# Patient Record
Sex: Female | Born: 1980 | Race: Black or African American | Hispanic: No | Marital: Single | Smoking: Current every day smoker
Health system: Southern US, Community
[De-identification: ages and names within clinical notes are randomized; demographics above are authoritative.]

## PROBLEM LIST (undated history)

## (undated) ENCOUNTER — Inpatient Hospital Stay (HOSPITAL_COMMUNITY): Payer: Self-pay

## (undated) DIAGNOSIS — O009 Unspecified ectopic pregnancy without intrauterine pregnancy: Secondary | ICD-10-CM

## (undated) DIAGNOSIS — D219 Benign neoplasm of connective and other soft tissue, unspecified: Secondary | ICD-10-CM

---
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
## 2005-06-21 ENCOUNTER — Emergency Department (HOSPITAL_COMMUNITY): Admission: EM | Admit: 2005-06-21 | Discharge: 2005-06-21 | Payer: Self-pay | Admitting: *Deleted

## 2006-03-22 ENCOUNTER — Emergency Department (HOSPITAL_COMMUNITY): Admission: EM | Admit: 2006-03-22 | Discharge: 2006-03-22 | Payer: Self-pay | Admitting: *Deleted

## 2014-01-17 ENCOUNTER — Encounter (HOSPITAL_COMMUNITY): Payer: Self-pay | Admitting: Emergency Medicine

## 2014-01-17 ENCOUNTER — Emergency Department (INDEPENDENT_AMBULATORY_CARE_PROVIDER_SITE_OTHER)
Admission: EM | Admit: 2014-01-17 | Discharge: 2014-01-17 | Disposition: A | Discharge status: Home / Self Care | Payer: Self-pay | Source: Home / Self Care | Attending: Emergency Medicine | Admitting: Emergency Medicine

## 2014-01-17 ENCOUNTER — Other Ambulatory Visit (HOSPITAL_COMMUNITY)
Admission: RE | Admit: 2014-01-17 | Discharge: 2014-01-17 | Disposition: A | Discharge status: Home / Self Care | Payer: Self-pay | Source: Ambulatory Visit | Attending: Emergency Medicine | Admitting: Emergency Medicine

## 2014-01-17 DIAGNOSIS — K610 Anal abscess: Secondary | ICD-10-CM

## 2014-01-17 DIAGNOSIS — K612 Anorectal abscess: Secondary | ICD-10-CM

## 2014-01-17 DIAGNOSIS — N938 Other specified abnormal uterine and vaginal bleeding: Secondary | ICD-10-CM

## 2014-01-17 DIAGNOSIS — Z113 Encounter for screening for infections with a predominantly sexual mode of transmission: Secondary | ICD-10-CM | POA: Insufficient documentation

## 2014-01-17 DIAGNOSIS — N760 Acute vaginitis: Secondary | ICD-10-CM | POA: Insufficient documentation

## 2014-01-17 LAB — POCT URINALYSIS DIP (DEVICE)
Bilirubin Urine: NEGATIVE
Glucose, UA: NEGATIVE mg/dL
Ketones, ur: 15 mg/dL — AB
Leukocytes, UA: NEGATIVE
Nitrite: NEGATIVE
Protein, ur: 30 mg/dL — AB
Specific Gravity, Urine: >=1.03 (ref 1.005–1.030)
Urobilinogen, UA: 2 mg/dL — ABNORMAL HIGH (ref 0.0–1.0)
pH: 6 (ref 5.0–8.0)

## 2014-01-17 LAB — POCT PREGNANCY, URINE
Preg Test, Ur: NEGATIVE
Preg Test, Ur: NEGATIVE

## 2014-01-17 MED ORDER — METRONIDAZOLE 500 MG PO TABS: 500.0000 mg | ORAL_TABLET | Freq: Two times a day (BID) | ORAL | Status: DC

## 2014-01-17 MED ORDER — HYDROCODONE-ACETAMINOPHEN 5-325 MG PO TABS: ORAL_TABLET | ORAL | Status: DC

## 2014-01-17 MED ORDER — MEDROXYPROGESTERONE ACETATE 10 MG PO TABS: 10.0000 mg | ORAL_TABLET | Freq: Every day | ORAL | Status: DC

## 2014-01-17 MED ORDER — CIPROFLOXACIN HCL 500 MG PO TABS: 500.0000 mg | ORAL_TABLET | Freq: Two times a day (BID) | ORAL | Status: DC

## 2014-01-17 NOTE — Discharge Instructions (Signed)
Abnormal Uterine Bleeding Abnormal uterine bleeding means bleeding from the vagina that is not your normal menstrual period. This can be:  Bleeding or spotting between periods.  Bleeding after sex (sexual intercourse).  Bleeding that is heavier or more than normal.  Periods that last longer than usual.  Bleeding after menopause. There are many problems that may cause this. Treatment will depend on the cause of the bleeding. Any kind of bleeding that is not normal should be reviewed by your doctor.  HOME CARE Watch your condition for any changes. These actions may lessen any discomfort you are having:  Do not use tampons or douches as told by your doctor.  Change your pads often. You should get regular pelvic exams and Pap tests. Keep all appointments for tests as told by your doctor. GET HELP IF:  You are bleeding for more than 1 week.  You feel dizzy at times. GET HELP RIGHT AWAY IF:   You pass out.  You have to change pads every 15 to 30 minutes.  You have belly pain.  You have a fever.  You become sweaty or weak.  You are passing large blood clots from the vagina.  You feel sick to your stomach (nauseous) and throw up (vomit). MAKE SURE YOU:  Understand these instructions.  Will watch your condition.  Will get help right away if you are not doing well or get worse. Document Released: 05/29/2009 Document Revised: 05/22/2013 Document Reviewed: 02/28/2013 [facility] Patient Information 2014 [place], [place].  Peri-Rectal Abscess Your caregiver has diagnosed you as having a peri-rectal abscess. This is an infected area near the rectum that is filled with pus. If the abscess is near the surface of the skin, your caregiver may open (incise) the area and drain the pus. HOME CARE INSTRUCTIONS   If your abscess was opened up and drained. A small piece of gauze may be placed in the opening so that it can drain. Do not remove the gauze unless directed by your  caregiver.  A loose dressing may be placed over the abscess site. Change the dressing as often as necessary to keep it clean and dry.  After the drain is removed, the area may be washed with a gentle antiseptic (soap) four times per day.  A warm sitz bath, warm packs or heating pad may be used for pain relief, taking care not to burn yourself.  Return for a wound check in 1 day or as directed.  An "inflatable doughnut" may be used for sitting with added comfort. These can be purchased at a drugstore or medical supply house.  To reduce pain and straining with bowel movements, eat a high fiber diet with plenty of fruits and vegetables. Use stool softeners as recommended by your caregiver. This is especially important if narcotic type pain medications were prescribed as these may cause marked constipation.  Only take over-the-counter or prescription medicines for pain, discomfort, or fever as directed by your caregiver. SEEK IMMEDIATE MEDICAL CARE IF:   You have increasing pain that is not controlled by medication.  There is increased inflammation (redness), swelling, bleeding, or drainage from the area.  An oral temperature above 102 F (38.9 C) develops.  You develop chills or generalized malaise (feel lethargic or feel "washed out").  You develop any new symptoms (problems) you feel may be related to your present problem. Document Released: 07/29/2000 Document Revised: 10/24/2011 Document Reviewed: 07/29/2008 [facility] Patient Information 2014 [place].

## 2014-01-17 NOTE — ED Notes (Signed)
Pt c/o rectal pain onset 3 days Sx also include mild abd/back pain; 3/10 Denies bloody stools, urinary sx; normal bowel movements Alert w/no signs of acute distress.

## 2014-01-17 NOTE — ED Provider Notes (Signed)
Chief Complaint   Chief Complaint  Patient presents with  . Rectal Pain    History of Present Illness   Jessica Melton is a 33 year old female who presents with 2 problems: Anal pain, and abnormal vaginal bleeding.  1. Anal pain: This is been going on for 2-3 days. It's localized in the left side. She denies any fever or drainage. No constipation or diarrhea. No blood in the stool.  2. Abnormal bleeding: The patient received a Depo-Provera shot in January. She didn't have any bleeding for about 3 months, then for the past 2 months she's had daily bleeding, about the same as her normal menses with some clots she denies any pelvic pain or cramping. She is sexually active with use of condoms.  Review of Systems   Other than as noted above, the patient denies any of the following symptoms: Systemic:  No fever or chills GI:  No abdominal pain, nausea, vomiting, diarrhea, constipation, melena or hematochezia. GU:  No dysuria, frequency, urgency, hematuria, vaginal discharge, itching, or abnormal vaginal bleeding.  [place]   Past medical history, family history, social history, meds, and allergies were reviewed.    Physical Examination    Vital signs:  BP 125/76  Pulse 93  Temp(Src) 98.6 F (37 C) (Oral)  Resp 18  SpO2 100% General:  Alert, oriented and in no distress. Lungs:  Breath sounds clear and equal bilaterally.  No wheezes, rales or rhonchi. Heart:  Regular rhythm.  No gallops or murmers. Abdomen:  Soft, flat and non-distended.  No organomegaly or mass.  No tenderness, guarding or rebound.  Bowel sounds normally active. Pelvic exam:  Normal external genitalia, vaginal and cervical mucosa were unremarkable. There was no blood in the vaginal vault. No pain on cervical motion. Uterus was normal in size and shape and nontender. No adnexal mass or tenderness.  DNA probes for gonorrhea, Chlamydia, Trichomonas, Gardnerella, Candida were obtained. Rectal exam: Just adjacent to the  anus, on the left side and posterior to the anus, there was a 1.5 cm raised, red, fluctuant area without any drainage. Skin:  Clear, warm and dry.  Chaperoned by Mickeal Needy, EMT who was present throughout the pelvic exam.   Labs   Results for orders placed during the hospital encounter of 01/17/14  POCT URINALYSIS DIP (DEVICE)      Result Value Ref Range   Glucose, UA NEGATIVE  NEGATIVE mg/dL   Bilirubin Urine NEGATIVE  NEGATIVE   Ketones, ur 15 (*) NEGATIVE mg/dL   Specific Gravity, Urine >=1.030  1.005 - 1.030   Hgb urine dipstick MODERATE (*) NEGATIVE   pH 6.0  5.0 - 8.0   Protein, ur 30 (*) NEGATIVE mg/dL   Urobilinogen, UA 2.0 (*) 0.0 - 1.0 mg/dL   Nitrite NEGATIVE  NEGATIVE   Leukocytes, UA NEGATIVE  NEGATIVE  POCT PREGNANCY, URINE      Result Value Ref Range   Preg Test, Ur NEGATIVE  NEGATIVE  POCT PREGNANCY, URINE      Result Value Ref Range   Preg Test, Ur NEGATIVE  NEGATIVE    Procedure Note:  Verbal informed consent was obtained from the patient.  Risks and benefits were outlined with the patient.  Patient understands and accepts these risks. A time out was called and the procedure and identity of the patient were confirmed verbally.    The procedure was then performed as follows:  The abscess in the perianal area was prepped with Betadine and alcohol and anesthetized with  2% Xylocaine without epinephrine. A single incision was made into the area of fluctuance a large amount of malodorous pus was drained out. A small amount of packing was put in place followed by sterile dressing. The patient is to return again in 48 hours to have this removed. A culture of the pus was obtained.  The patient tolerated the procedure well without any immediate complications.    Assessment   The primary encounter diagnosis was Anal abscess. A diagnosis of Dysfunctional uterine bleeding was also pertinent to this visit.       Plan    1.  Meds:  The following meds were prescribed:    Discharge Medication List as of 01/17/2014  8:47 PM    START taking these medications   Details  ciprofloxacin (CIPRO) 500 MG tablet Take 1 tablet (500 mg total) by mouth every 12 (twelve) hours., Starting 01/17/2014, Until Discontinued, Normal    HYDROcodone-acetaminophen (NORCO/VICODIN) 5-325 MG per tablet 1 to 2 tabs every 4 to 6 hours as needed for pain., Print    medroxyPROGESTERone (PROVERA) 10 MG tablet Take 1 tablet (10 mg total) by mouth daily., Starting 01/17/2014, Until Discontinued, Normal    metroNIDAZOLE (FLAGYL) 500 MG tablet Take 1 tablet (500 mg total) by mouth 2 (two) times daily., Starting 01/17/2014, Until Discontinued, Normal        2.  Patient Education/Counseling:  The patient was given appropriate handouts, self care instructions, and instructed in symptomatic relief.  After the patient has a packing removal she may start warm sitz baths with Epsom salts.  3.  Follow up:  The patient was told to follow up here if no better in 3 to 4 days, or sooner if becoming worse in any way, and given some red flag symptoms such as worsening pain, fever, persistent vomiting, or heavy vaginal bleeding which would prompt immediate return.  Followup here in 48 hours for recheck on abscess, if abnormal bleeding persists should followup at [facility] hospital clinics.     Harden Mo, MD 01/17/14 [phone]

## 2014-01-20 ENCOUNTER — Telehealth (HOSPITAL_COMMUNITY): Payer: Self-pay | Admitting: *Deleted

## 2014-01-21 LAB — CULTURE, ROUTINE-ABSCESS: Special Requests: NORMAL

## 2014-01-22 NOTE — ED Notes (Signed)
GC/Chlamydia neg., Affirm: Candida neg., Gardnerella and Trich pos., Abscess culture anal:  Multiple organisms present, none predominant.  Pt. adequately treated with Flagyl.  6/8 Left message.  Call 1. 6/9 Left message.  Call 2. 6/10 Left message.  Call 3. Roselyn Meier 01/22/2014

## 2014-01-24 NOTE — ED Notes (Signed)
Unable to reach pt. by phone x 3. Confidential marked letter sent with results and instructions. 01/24/2014

## 2014-03-06 ENCOUNTER — Emergency Department (INDEPENDENT_AMBULATORY_CARE_PROVIDER_SITE_OTHER)
Admission: EM | Admit: 2014-03-06 | Discharge: 2014-03-06 | Disposition: A | Discharge status: Home / Self Care | Payer: Medicaid Other | Source: Home / Self Care | Attending: Family Medicine | Admitting: Family Medicine

## 2014-03-06 ENCOUNTER — Encounter (HOSPITAL_COMMUNITY): Payer: Self-pay | Admitting: Emergency Medicine

## 2014-03-06 DIAGNOSIS — N939 Abnormal uterine and vaginal bleeding, unspecified: Secondary | ICD-10-CM

## 2014-03-06 DIAGNOSIS — N926 Irregular menstruation, unspecified: Secondary | ICD-10-CM

## 2014-03-06 LAB — POCT I-STAT, CHEM 8
BUN: 9 mg/dL (ref 6–23)
CHLORIDE: 104 meq/L (ref 96–112)
CREATININE: 0.8 mg/dL (ref 0.50–1.10)
Calcium, Ion: 1.28 mmol/L — ABNORMAL HIGH (ref 1.12–1.23)
GLUCOSE: 92 mg/dL (ref 70–99)
HCT: 41 % (ref 36.0–46.0)
Hemoglobin: 13.9 g/dL (ref 12.0–15.0)
POTASSIUM: 3.7 meq/L (ref 3.7–5.3)
Sodium: 139 mEq/L (ref 137–147)
TCO2: 22 mmol/L (ref 0–100)

## 2014-03-06 LAB — POCT PREGNANCY, URINE: Preg Test, Ur: NEGATIVE

## 2014-03-06 MED ORDER — MEDROXYPROGESTERONE ACETATE 10 MG PO TABS: 10.0000 mg | ORAL_TABLET | Freq: Every day | ORAL | Status: DC

## 2014-03-06 MED ORDER — NORETHINDRONE 0.35 MG PO TABS: 1.0000 | ORAL_TABLET | Freq: Every day | ORAL | Status: DC

## 2014-03-06 NOTE — Discharge Instructions (Signed)
Your bleeding is a normal response to hormonal control of your periods or let down from hormonal use.  This may take several  Months to regulate or become normal Your symptoms are all due to your period There is no evidence for serious underlying illness.  Please start the provera.  You can start the birth control pills if you want after the provera.  Use ibuprofen 400-600mg  every 4-6 hours, hydration, and rest as needed for your symptoms.

## 2014-03-06 NOTE — ED Provider Notes (Signed)
CSN: [id number]     Arrival date & time 03/06/14  1039 History   First MD Initiated Contact with Patient 03/06/14 1127     Chief Complaint  Patient presents with  . Metrorrhagia  . Abdominal Cramping   (Consider location/radiation/quality/duration/timing/severity/associated sxs/prior Treatment) HPI  Abnormal uterine bleeding: seen on 6/5. For similar complaint. Previously on depo but developed abnormally long periods after stopping. Given provera for 5 days. Which worked initially. LMP 3 days ago but is extra heavy and w/ clots. Associated w/ dizziness and weakness. Denies CP, SOb, palpitations, Syncope. Sexually active and not using any birth control and only 50% use of condoms.  History reviewed. No pertinent past medical history. History reviewed. No pertinent past surgical history. History reviewed. No pertinent family history. History  Substance Use Topics  . Smoking status: Current Every Day Smoker -- 0.50 packs/day    Types: Cigarettes  . Smokeless tobacco: Not on file  . Alcohol Use: Yes   OB History   Grav Para Term Preterm Abortions TAB SAB Ect Mult Living                 Review of Systems Per HPI with all other pertinent systems negative.    Allergies  Review of patient's allergies indicates no known allergies.  Home Medications   Prior to Admission medications   Medication Sig Start Date End Date Taking? Authorizing Provider  ciprofloxacin (CIPRO) 500 MG tablet Take 1 tablet (500 mg total) by mouth every 12 (twelve) hours. 01/17/14   Harden Mo, MD  HYDROcodone-acetaminophen (NORCO/VICODIN) 5-325 MG per tablet 1 to 2 tabs every 4 to 6 hours as needed for pain. 01/17/14   Harden Mo, MD  medroxyPROGESTERone (PROVERA) 10 MG tablet Take 1 tablet (10 mg total) by mouth daily. 03/06/14   Waldemar Dickens, MD  metroNIDAZOLE (FLAGYL) 500 MG tablet Take 1 tablet (500 mg total) by mouth 2 (two) times daily. 01/17/14   Harden Mo, MD  norethindrone  (MICRONOR,CAMILA,ERRIN) 0.35 MG tablet Take 1 tablet (0.35 mg total) by mouth daily. 03/06/14   Waldemar Dickens, MD   BP 113/75  Pulse 72  Temp(Src) 98.7 F (37.1 C) (Oral)  Resp 16  SpO2 100% Physical Exam  Constitutional: She appears well-developed and well-nourished. No distress.  HENT:  Head: Normocephalic and atraumatic.  Eyes: EOM are normal. Pupils are equal, round, and reactive to light.  Neck: Normal range of motion.  Pulmonary/Chest: Effort normal. No respiratory distress.  Abdominal: Soft. She exhibits no distension.  Musculoskeletal: She exhibits no tenderness.  Neurological: She is alert. She exhibits normal muscle tone.  Skin: Skin is warm. No rash noted. She is not diaphoretic.  Psychiatric: She has a normal mood and affect. Her behavior is normal. Judgment and thought content normal.    ED Course  Procedures (including critical care time) Labs Review Labs Reviewed  POCT I-STAT, CHEM 8 - Abnormal; Notable for the following:    Calcium, Ion 1.28 (*)    All other components within normal limits  PREGNANCY, URINE  POCT PREGNANCY, URINE    Imaging Review No results found.    MDM   1. Abnormal uterine bleeding    Likely due to hormonal therapies in the past. Pt symptoms improved after provera but returned which is not surprising. Hgb is nml. Urine preg neg. Recommending waitful watching vs another round of provera followed by OCP. Pt opting to try another round of medications. Of note pt also does not  want to get pregnant. Start provery 10mg  x 10 days followed by [employer]. F/u PCP. NSAIDs, fluids, rest for other symptoms Precautions given and all questions answered   Linna Darner, MD Family Medicine 03/06/2014, 12:23 PM      Waldemar Dickens, MD 03/06/14 [phone]

## 2014-03-06 NOTE — ED Notes (Signed)
Reports being seen in June for a cycle lasting 3 months was given meds to stop bleeding.   States possibly started cycle 3 days ago but is having heavy bleeding with clots.   Abdominal cramping.

## 2014-07-16 ENCOUNTER — Inpatient Hospital Stay (HOSPITAL_COMMUNITY)
Admission: AD | Admit: 2014-07-16 | Discharge: 2014-07-17 | Disposition: A | Discharge status: Home / Self Care | Payer: 59 | Source: Ambulatory Visit | Attending: Obstetrics & Gynecology | Admitting: Obstetrics & Gynecology

## 2014-07-16 ENCOUNTER — Emergency Department (HOSPITAL_COMMUNITY)
Admission: EM | Admit: 2014-07-16 | Discharge: 2014-07-16 | Disposition: A | Discharge status: Nursing Home (Includes ICF) | Payer: 59 | Source: Home / Self Care | Attending: Family Medicine | Admitting: Family Medicine

## 2014-07-16 ENCOUNTER — Inpatient Hospital Stay (HOSPITAL_COMMUNITY): Payer: 59

## 2014-07-16 ENCOUNTER — Encounter (HOSPITAL_COMMUNITY): Payer: Self-pay

## 2014-07-16 ENCOUNTER — Encounter (HOSPITAL_COMMUNITY): Payer: Self-pay | Admitting: *Deleted

## 2014-07-16 DIAGNOSIS — F17210 Nicotine dependence, cigarettes, uncomplicated: Secondary | ICD-10-CM | POA: Insufficient documentation

## 2014-07-16 DIAGNOSIS — Z3490 Encounter for supervision of normal pregnancy, unspecified, unspecified trimester: Secondary | ICD-10-CM

## 2014-07-16 DIAGNOSIS — R109 Unspecified abdominal pain: Secondary | ICD-10-CM | POA: Diagnosis present

## 2014-07-16 DIAGNOSIS — O039 Complete or unspecified spontaneous abortion without complication: Secondary | ICD-10-CM | POA: Diagnosis not present

## 2014-07-16 DIAGNOSIS — Z3A08 8 weeks gestation of pregnancy: Secondary | ICD-10-CM | POA: Diagnosis not present

## 2014-07-16 DIAGNOSIS — O00109 Unspecified tubal pregnancy without intrauterine pregnancy: Secondary | ICD-10-CM | POA: Diagnosis present

## 2014-07-16 DIAGNOSIS — R58 Hemorrhage, not elsewhere classified: Secondary | ICD-10-CM

## 2014-07-16 DIAGNOSIS — N939 Abnormal uterine and vaginal bleeding, unspecified: Secondary | ICD-10-CM

## 2014-07-16 DIAGNOSIS — Z331 Pregnant state, incidental: Secondary | ICD-10-CM

## 2014-07-16 DIAGNOSIS — R1030 Lower abdominal pain, unspecified: Secondary | ICD-10-CM

## 2014-07-16 DIAGNOSIS — F17200 Nicotine dependence, unspecified, uncomplicated: Secondary | ICD-10-CM | POA: Diagnosis present

## 2014-07-16 HISTORY — DX: Unspecified ectopic pregnancy without intrauterine pregnancy: O00.90

## 2014-07-16 HISTORY — DX: Benign neoplasm of connective and other soft tissue, unspecified: D21.9

## 2014-07-16 LAB — POCT URINALYSIS DIP (DEVICE)
Bilirubin Urine: NEGATIVE
Glucose, UA: NEGATIVE mg/dL
KETONES UR: NEGATIVE mg/dL
Leukocytes, UA: NEGATIVE
Nitrite: NEGATIVE
PH: 6 (ref 5.0–8.0)
PROTEIN: NEGATIVE mg/dL
Specific Gravity, Urine: >=1.03 (ref 1.005–1.030)
Urobilinogen, UA: 0.2 mg/dL (ref 0.0–1.0)

## 2014-07-16 LAB — POCT PREGNANCY, URINE: Preg Test, Ur: POSITIVE — AB

## 2014-07-16 LAB — CBC
HEMATOCRIT: 33 % — AB (ref 36.0–46.0)
HEMOGLOBIN: 10.6 g/dL — AB (ref 12.0–15.0)
MCH: 21.5 pg — AB (ref 26.0–34.0)
MCHC: 32.1 g/dL (ref 30.0–36.0)
MCV: 67.1 fL — AB (ref 78.0–100.0)
Platelets: 226 10*3/uL (ref 150–400)
RBC: 4.92 MIL/uL (ref 3.87–5.11)
RDW: 19.8 % — ABNORMAL HIGH (ref 11.5–15.5)
WBC: 7.8 10*3/uL (ref 4.0–10.5)

## 2014-07-16 LAB — URINALYSIS, ROUTINE W REFLEX MICROSCOPIC
BILIRUBIN URINE: NEGATIVE
Glucose, UA: NEGATIVE mg/dL
Ketones, ur: NEGATIVE mg/dL
Leukocytes, UA: NEGATIVE
NITRITE: NEGATIVE
PH: 6 (ref 5.0–8.0)
Protein, ur: NEGATIVE mg/dL
Specific Gravity, Urine: 1.02 (ref 1.005–1.030)
Urobilinogen, UA: 0.2 mg/dL (ref 0.0–1.0)

## 2014-07-16 LAB — URINE MICROSCOPIC-ADD ON

## 2014-07-16 LAB — WET PREP, GENITAL
Trich, Wet Prep: NONE SEEN
YEAST WET PREP: NONE SEEN

## 2014-07-16 LAB — HCG, QUANTITATIVE, PREGNANCY: HCG, BETA CHAIN, QUANT, S: 185 m[IU]/mL — AB (ref <5)

## 2014-07-16 MED ORDER — OXYCODONE-ACETAMINOPHEN 5-325 MG PO TABS
1.0000 | ORAL_TABLET | Freq: Once | ORAL | Status: AC
  Administered 2014-07-16: 1 via ORAL
  Filled 2014-07-16: qty 1

## 2014-07-16 MED ORDER — HYDROMORPHONE HCL 1 MG/ML IJ SOLN
1.0000 mg | Freq: Once | INTRAMUSCULAR | Status: AC
  Administered 2014-07-17: 1 mg via INTRAMUSCULAR
  Filled 2014-07-16: qty 1

## 2014-07-16 MED ORDER — PROMETHAZINE HCL 25 MG PO TABS
25.0000 mg | ORAL_TABLET | Freq: Once | ORAL | Status: AC
  Administered 2014-07-16: 25 mg via ORAL
  Filled 2014-07-16: qty 1

## 2014-07-16 NOTE — MAU Note (Signed)
Spotting since last Wednesday. Positive pregnancy test at home & urgent care. Lower abdominal pain today. Denies urinary complaints. Unsure when LMP was, thinks sometime in October.

## 2014-07-16 NOTE — ED Notes (Signed)
Pt. asked for something for pain.  She said her brother is here now and can drive her.  I asked PA and he said nothing by mouth now.  Pt. States she has been in pain all day and is afraid she is going to have to wait. I told her [facility] called and they will be expecting her.

## 2014-07-16 NOTE — ED Notes (Signed)
[facility] and will be discharged from here.

## 2014-07-16 NOTE — ED Notes (Signed)
C/o lower abdominal pain onset today.  Vaginal bleeding onset last Wednesday.  It was light until today.  She had to wear a pad today.  Only 1 pad-states it is bright red blood.  C/o nausea.  V x 1 today and "for the past week 2-3 x a day and may skips a day here and there."  Breasts are tender.  Had pos. preg test at home.

## 2014-07-16 NOTE — ED Provider Notes (Signed)
CSN: [id number]     Arrival date & time 07/16/14  1855 History   First MD Initiated Contact with Patient 07/16/14 1911     No chief complaint on file.  (Consider location/radiation/quality/duration/timing/severity/associated sxs/prior Treatment) HPI           33 year old female with history of ectopic pregnancy presents complaining of abdominal pain, vaginal bleeding, and positive urine pregnancy test. She has had abdominal pain for about one week that is across her lower abdomen, crampy in nature. She has also had slight spotting. Today the bleeding and the pain got to be more heavy. She did a urine pregnancy test at home which was positive. She denies fever, chills, NVD.  No past medical history on file. No past surgical history on file. No family history on file. History  Substance Use Topics  . Smoking status: Current Every Day Smoker -- 0.50 packs/day    Types: Cigarettes  . Smokeless tobacco: Not on file  . Alcohol Use: Yes   OB History    No data available     Review of Systems  Gastrointestinal: Positive for abdominal pain. Negative for nausea, vomiting and diarrhea.  All other systems reviewed and are negative.   Allergies  Review of patient's allergies indicates no known allergies.  Home Medications   Prior to Admission medications   Medication Sig Start Date End Date Taking? Authorizing Provider  ciprofloxacin (CIPRO) 500 MG tablet Take 1 tablet (500 mg total) by mouth every 12 (twelve) hours. 01/17/14   Harden Mo, MD  HYDROcodone-acetaminophen (NORCO/VICODIN) 5-325 MG per tablet 1 to 2 tabs every 4 to 6 hours as needed for pain. 01/17/14   Harden Mo, MD  medroxyPROGESTERone (PROVERA) 10 MG tablet Take 1 tablet (10 mg total) by mouth daily. 03/06/14   Waldemar Dickens, MD  metroNIDAZOLE (FLAGYL) 500 MG tablet Take 1 tablet (500 mg total) by mouth 2 (two) times daily. 01/17/14   Harden Mo, MD  norethindrone (MICRONOR,CAMILA,ERRIN) 0.35 MG tablet Take 1 tablet  (0.35 mg total) by mouth daily. 03/06/14   Waldemar Dickens, MD   BP 121/80 mmHg  Pulse 83  Temp(Src) 98.1 F (36.7 C) (Oral)  Resp 15  SpO2 100% Physical Exam  Constitutional: She is oriented to person, place, and time. Vital signs are normal. She appears well-developed and well-nourished. No distress.  HENT:  Head: Normocephalic and atraumatic.  Cardiovascular: Normal rate, regular rhythm and normal heart sounds.   Pulmonary/Chest: Effort normal and breath sounds normal. No respiratory distress.  Abdominal: Soft. Bowel sounds are normal. She exhibits no distension and no mass. There is tenderness (minimal suprapubic tenderness). There is no rebound and no guarding.  Neurological: She is alert and oriented to person, place, and time. She has normal strength. Coordination normal.  Skin: Skin is warm and dry. No rash noted. She is not diaphoretic.  Psychiatric: She has a normal mood and affect. Judgment normal.  Nursing note and vitals reviewed.   ED Course  Procedures (including critical care time) Labs Review Labs Reviewed  POCT URINALYSIS DIP (DEVICE) - Abnormal; Notable for the following:    Hgb urine dipstick LARGE (*)    All other components within normal limits  POCT PREGNANCY, URINE - Abnormal; Notable for the following:    Preg Test, Ur POSITIVE (*)    All other components within normal limits    Imaging Review No results found.   MDM   1. Lower abdominal pain   2. Vagina  bleeding   3. Pregnancy    Urine pregnancy test is positive. Patient is being transferred to [facility] hospital, her brother is here to drive her.  I have expressed to her the [facility] and the consequences of not going, up to and including death.    Liam Graham, PA-C 07/16/14 [place] Mikaele Stecher, PA-C 07/16/14 1947

## 2014-07-16 NOTE — MAU Provider Note (Signed)
History     CSN: [id number]  Arrival date and time: 07/16/14 2018   First Provider Initiated Contact with Patient 07/16/14 2114      Chief Complaint  Patient presents with  . Abdominal Pain  . Vaginal Bleeding   HPI Comments: Jessica Melton 33 y.o. [id number] [redacted]w[redacted]d presents to MAU with vaginal bleeding and abdominal pains that have been off and on since Wednesday. It is now 6/10 and no medications were taken. She is also having some nausea and vomiting. She was seen earlier at Urgent care and sent here. Unsure LMP.  She has a history of Ectopic Pregnancy and SAB.  Abdominal Pain Associated symptoms include nausea and vomiting.  Vaginal Bleeding Associated symptoms include abdominal pain, nausea and vomiting.      Past Medical History  Diagnosis Date  . Fibroids   . Ectopic pregnancy     Past Surgical History  Procedure Laterality Date  . Dilation and curettage of uterus  2003    Family History  Problem Relation Age of Onset  . Cirrhosis Father   . Kidney failure Father     History  Substance Use Topics  . Smoking status: Current Every Day Smoker -- 0.50 packs/day    Types: Cigarettes  . Smokeless tobacco: Not on file  . Alcohol Use: No     Comment: socially    Allergies:  Allergies  Allergen Reactions  . Iodine Swelling    Tongue swelling    Prescriptions prior to admission  Medication Sig Dispense Refill Last Dose  . ciprofloxacin (CIPRO) 500 MG tablet Take 1 tablet (500 mg total) by mouth every 12 (twelve) hours. 14 tablet 0 Unknown at Unknown time  . HYDROcodone-acetaminophen (NORCO/VICODIN) 5-325 MG per tablet 1 to 2 tabs every 4 to 6 hours as needed for pain. 20 tablet 0 Unknown at Unknown time  . ibuprofen (ADVIL,MOTRIN) 800 MG tablet Take 800 mg by mouth every 8 (eight) hours as needed.   07/16/2014 at 1630  . medroxyPROGESTERone (PROVERA) 10 MG tablet Take 1 tablet (10 mg total) by mouth daily. 10 tablet 0   . metroNIDAZOLE (FLAGYL) 500 MG tablet Take  1 tablet (500 mg total) by mouth 2 (two) times daily. 14 tablet 0 Unknown at Unknown time  . norethindrone (MICRONOR,CAMILA,ERRIN) 0.35 MG tablet Take 1 tablet (0.35 mg total) by mouth daily. 1 Package 3     Review of Systems  Constitutional: Negative.   HENT: Negative.   Respiratory: Negative.   Cardiovascular: Negative.   Gastrointestinal: Positive for nausea, vomiting and abdominal pain.  Genitourinary: Negative.        Vaginal bleeding  Musculoskeletal: Negative.   Skin: Negative.   Neurological: Negative.   Psychiatric/Behavioral: Negative.    Physical Exam   Blood pressure 121/80, pulse 82, temperature 99 F (37.2 C), temperature source Oral, resp. rate 16, height 5' 7.5" (1.715 m), weight 105.235 kg (232 lb), last menstrual period 05/16/2014, SpO2 100 %.  Physical Exam  Constitutional: She appears well-developed and well-nourished. No distress.  HENT:  Head: Normocephalic and atraumatic.  Eyes: Pupils are equal, round, and reactive to light.  GI: Soft. There is tenderness.  Genitourinary:  Genital:external bleedy Vaginal: small amount blood/ one fox swab cleared Cervix:closed/ thick Bimanual:uterus tender    Results for orders placed or performed during the hospital encounter of 07/16/14 (from the past 24 hour(s))  Urinalysis, Routine w reflex microscopic     Status: Abnormal   Collection Time: 07/16/14  8:51 PM  Result  Value Ref Range   Color, Urine YELLOW YELLOW   APPearance CLEAR CLEAR   Specific Gravity, Urine 1.020 1.005 - 1.030   pH 6.0 5.0 - 8.0   Glucose, UA NEGATIVE NEGATIVE mg/dL   Hgb urine dipstick LARGE (A) NEGATIVE   Bilirubin Urine NEGATIVE NEGATIVE   Ketones, ur NEGATIVE NEGATIVE mg/dL   Protein, ur NEGATIVE NEGATIVE mg/dL   Urobilinogen, UA 0.2 0.0 - 1.0 mg/dL   Nitrite NEGATIVE NEGATIVE   Leukocytes, UA NEGATIVE NEGATIVE  Urine microscopic-add on     Status: None   Collection Time: 07/16/14  8:51 PM  Result Value Ref Range   Squamous  Epithelial / LPF RARE RARE   WBC, UA 0-2 <3 WBC/hpf   RBC / HPF 7-10 <3 RBC/hpf   Bacteria, UA RARE RARE  Wet prep, genital     Status: Abnormal   Collection Time: 07/16/14  9:20 PM  Result Value Ref Range   Yeast Wet Prep HPF POC NONE SEEN NONE SEEN   Trich, Wet Prep NONE SEEN NONE SEEN   Clue Cells Wet Prep HPF POC FEW (A) NONE SEEN   WBC, Wet Prep HPF POC FEW (A) NONE SEEN  CBC     Status: Abnormal   Collection Time: 07/16/14  9:30 PM  Result Value Ref Range   WBC 7.8 4.0 - 10.5 K/uL   RBC 4.92 3.87 - 5.11 MIL/uL   Hemoglobin 10.6 (L) 12.0 - 15.0 g/dL   HCT 33.0 (L) 36.0 - 46.0 %   MCV 67.1 (L) 78.0 - 100.0 fL   MCH 21.5 (L) 26.0 - 34.0 pg   MCHC 32.1 30.0 - 36.0 g/dL   RDW 19.8 (H) 11.5 - 15.5 %   Platelets 226 150 - 400 K/uL  hCG, quantitative, pregnancy     Status: Abnormal   Collection Time: 07/16/14  9:30 PM  Result Value Ref Range   hCG, Beta Chain, Quant, S 185 (H) <5 mIU/mL  ABO/Rh     Status: None (Preliminary result)   Collection Time: 07/16/14  9:30 PM  Result Value Ref Range   ABO/RH(D) O POS    US Ob Comp Less 14 Wks  07/17/2014   CLINICAL DATA:  Acute onset of lower abdominal pain and vaginal bleeding. Initial encounter.  EXAM: OBSTETRIC <14 WK [place] AND TRANSVAGINAL OB US  TECHNIQUE: Both transabdominal and transvaginal ultrasound examinations were performed for complete evaluation of the gestation as well as the maternal uterus, adnexal regions, and pelvic cul-de-sac. Transvaginal technique was performed to assess early pregnancy.  COMPARISON:  None.  FINDINGS: Intrauterine gestational sac: None seen.  Yolk sac:  N/A  Embryo:  N/A  Maternal uterus/adnexae: An irregular elongated small collection of fluid at the lower uterine segment may reflect a gestational sac. It measures approximately 0.6 cm in mean sac diameter. This raises suspicion for spontaneous abortion in progress.  No subchorionic hemorrhage is noted. The uterus is otherwise unremarkable in appearance.   The ovaries are within normal limits. The right ovary measures 4.2 x 2.0 x 1.8 cm, while the left ovary measures 4.6 x 2.2 x 4.0 cm. No suspicious adnexal masses are seen; there is no evidence for ovarian torsion.  A small amount of free fluid is noted within the pelvic cul-de-sac, mildly complex in appearance.  IMPRESSION: 1. Suspect spontaneous abortion in progress, with a small irregular elongated collection of fluid at the lower uterine segment. Uterus otherwise unremarkable in appearance. 2. Small amount of free fluid within the pelvic  cul-de-sac is mildly complex in appearance, of uncertain significance. No definite evidence of ectopic pregnancy. If the patient's quantitative beta HCG level continues to trend upward, follow-up pelvic ultrasound could be considered as deemed clinically appropriate.   Electronically Signed   By: Garald Balding M.D.   On: 07/17/2014 00:00   US Ob Transvaginal  07/17/2014   CLINICAL DATA:  Acute onset of lower abdominal pain and vaginal bleeding. Initial encounter.  EXAM: OBSTETRIC <14 WK [place] AND TRANSVAGINAL OB US  TECHNIQUE: Both transabdominal and transvaginal ultrasound examinations were performed for complete evaluation of the gestation as well as the maternal uterus, adnexal regions, and pelvic cul-de-sac. Transvaginal technique was performed to assess early pregnancy.  COMPARISON:  None.  FINDINGS: Intrauterine gestational sac: None seen.  Yolk sac:  N/A  Embryo:  N/A  Maternal uterus/adnexae: An irregular elongated small collection of fluid at the lower uterine segment may reflect a gestational sac. It measures approximately 0.6 cm in mean sac diameter. This raises suspicion for spontaneous abortion in progress.  No subchorionic hemorrhage is noted. The uterus is otherwise unremarkable in appearance.  The ovaries are within normal limits. The right ovary measures 4.2 x 2.0 x 1.8 cm, while the left ovary measures 4.6 x 2.2 x 4.0 cm. No suspicious adnexal masses are seen;  there is no evidence for ovarian torsion.  A small amount of free fluid is noted within the pelvic cul-de-sac, mildly complex in appearance.  IMPRESSION: 1. Suspect spontaneous abortion in progress, with a small irregular elongated collection of fluid at the lower uterine segment. Uterus otherwise unremarkable in appearance. 2. Small amount of free fluid within the pelvic cul-de-sac is mildly complex in appearance, of uncertain significance. No definite evidence of ectopic pregnancy. If the patient's quantitative beta HCG level continues to trend upward, follow-up pelvic ultrasound could be considered as deemed clinically appropriate.   Electronically Signed   By: Garald Balding M.D.   On: 07/17/2014 00:00     MAU Course  Procedures  MDM Wet prep, GC, Chlamydia, CBC, UA, U/S, ABORh, Quant, HIV Percocet/ phenergan Pain got worse after ultrasound/ Dilaudid 1mg  IM  Assessment and Plan   A: SAB in progress  P: Reviewed all labs and ultrasound with patient Repeat Quant 48 hours Percocet/ phenergan for home Note for work till Monday Pelvic rest/avoid pregnancy x 3 cycles  Georgia Duff 07/16/2014, 9:23 PM

## 2014-07-17 LAB — GC/CHLAMYDIA PROBE AMP
CT Probe RNA: NEGATIVE
GC PROBE AMP APTIMA: NEGATIVE

## 2014-07-17 LAB — ABO/RH: ABO/RH(D): O POS

## 2014-07-17 LAB — HIV ANTIBODY (ROUTINE TESTING W REFLEX): HIV 1&2 Ab, 4th Generation: NONREACTIVE

## 2014-07-17 MED ORDER — HYDROCODONE-ACETAMINOPHEN 5-325 MG PO TABS: ORAL_TABLET | ORAL | Status: DC

## 2014-07-17 MED ORDER — PROMETHAZINE HCL 25 MG PO TABS: 25.0000 mg | ORAL_TABLET | Freq: Four times a day (QID) | ORAL | Status: DC | PRN

## 2014-07-17 NOTE — Discharge Instructions (Signed)
Miscarriage A miscarriage is the sudden loss of an unborn baby (fetus) before the 20th week of pregnancy. Most miscarriages happen in the first 3 months of pregnancy. Sometimes, it happens before a woman even knows she is pregnant. A miscarriage is also called a "spontaneous miscarriage" or "early pregnancy loss." Having a miscarriage can be an emotional experience. Talk with your caregiver about any questions you may have about miscarrying, the grieving process, and your future pregnancy plans. CAUSES   Problems with the fetal chromosomes that make it impossible for the baby to develop normally. Problems with the baby's genes or chromosomes are most often the result of errors that occur, by chance, as the embryo divides and grows. The problems are not inherited from the parents.  Infection of the cervix or uterus.   Hormone problems.   Problems with the cervix, such as having an incompetent cervix. This is when the tissue in the cervix is not strong enough to hold the pregnancy.   Problems with the uterus, such as an abnormally shaped uterus, uterine fibroids, or congenital abnormalities.   Certain medical conditions.   Smoking, drinking alcohol, or taking illegal drugs.   Trauma.  Often, the cause of a miscarriage is unknown.  SYMPTOMS   Vaginal bleeding or spotting, with or without cramps or pain.  Pain or cramping in the abdomen or lower back.  Passing fluid, tissue, or blood clots from the vagina. DIAGNOSIS  Your caregiver will perform a physical exam. You may also have an ultrasound to confirm the miscarriage. Blood or urine tests may also be ordered. TREATMENT   Sometimes, treatment is not necessary if you naturally pass all the fetal tissue that was in the uterus. If some of the fetus or placenta remains in the body (incomplete miscarriage), tissue left behind may become infected and must be removed. Usually, a dilation and curettage (D and C) procedure is performed.  During a D and C procedure, the cervix is widened (dilated) and any remaining fetal or placental tissue is gently removed from the uterus.  Antibiotic medicines are prescribed if there is an infection. Other medicines may be given to reduce the size of the uterus (contract) if there is a lot of bleeding.  If you have Rh negative blood and your baby was Rh positive, you will need a Rh immunoglobulin shot. This shot will protect any future baby from having Rh blood problems in future pregnancies. HOME CARE INSTRUCTIONS   Your caregiver may order bed rest or may allow you to continue light activity. Resume activity as directed by your caregiver.  Have someone help with home and family responsibilities during this time.   Keep track of the number of sanitary pads you use each day and how soaked (saturated) they are. Write down this information.   Do not use tampons. Do not douche or have sexual intercourse until approved by your caregiver.   Only take over-the-counter or prescription medicines for pain or discomfort as directed by your caregiver.   Do not take aspirin. Aspirin can cause bleeding.   Keep all follow-up appointments with your caregiver.   If you or your partner have problems with grieving, talk to your caregiver or seek counseling to help cope with the pregnancy loss. Allow enough time to grieve before trying to get pregnant again.  SEEK IMMEDIATE MEDICAL CARE IF:   You have severe cramps or pain in your back or abdomen.  You have a fever.  You pass large blood clots (walnut-sized   or larger) ortissue from your vagina. Save any tissue for your caregiver to inspect.   Your bleeding increases.   You have a thick, bad-smelling vaginal discharge.  You become lightheaded, weak, or you faint.   You have chills.  MAKE SURE YOU:  Understand these instructions.  Will watch your condition.  Will get help right away if you are not doing well or get  worse. Document Released: 01/25/2001 Document Revised: 11/26/2012 Document Reviewed: 09/20/2011 ExitCare Patient Information 2015 [employer]. This information is not intended to replace advice given to you by your health care provider. Make sure you discuss any questions you have with your health care provider.  

## 2014-07-18 ENCOUNTER — Encounter: Payer: Self-pay | Admitting: Family Medicine

## 2014-07-18 ENCOUNTER — Ambulatory Visit (INDEPENDENT_AMBULATORY_CARE_PROVIDER_SITE_OTHER): Payer: 59 | Admitting: Family Medicine

## 2014-07-18 VITALS — BP 111/69 | HR 90 | Wt 229.6 lb

## 2014-07-18 DIAGNOSIS — O209 Hemorrhage in early pregnancy, unspecified: Secondary | ICD-10-CM

## 2014-07-18 MED ORDER — HYDROCODONE-ACETAMINOPHEN 5-325 MG PO TABS: ORAL_TABLET | ORAL | Status: DC

## 2014-07-18 NOTE — Patient Instructions (Signed)
Miscarriage A miscarriage is the sudden loss of an unborn baby (fetus) before the 20th week of pregnancy. Most miscarriages happen in the first 3 months of pregnancy. Sometimes, it happens before a woman even knows she is pregnant. A miscarriage is also called a "spontaneous miscarriage" or "early pregnancy loss." Having a miscarriage can be an emotional experience. Talk with your caregiver about any questions you may have about miscarrying, the grieving process, and your future pregnancy plans. CAUSES   Problems with the fetal chromosomes that make it impossible for the baby to develop normally. Problems with the baby's genes or chromosomes are most often the result of errors that occur, by chance, as the embryo divides and grows. The problems are not inherited from the parents.  Infection of the cervix or uterus.   Hormone problems.   Problems with the cervix, such as having an incompetent cervix. This is when the tissue in the cervix is not strong enough to hold the pregnancy.   Problems with the uterus, such as an abnormally shaped uterus, uterine fibroids, or congenital abnormalities.   Certain medical conditions.   Smoking, drinking alcohol, or taking illegal drugs.   Trauma.  Often, the cause of a miscarriage is unknown.  SYMPTOMS   Vaginal bleeding or spotting, with or without cramps or pain.  Pain or cramping in the abdomen or lower back.  Passing fluid, tissue, or blood clots from the vagina. DIAGNOSIS  Your caregiver will perform a physical exam. You may also have an ultrasound to confirm the miscarriage. Blood or urine tests may also be ordered. TREATMENT   Sometimes, treatment is not necessary if you naturally pass all the fetal tissue that was in the uterus. If some of the fetus or placenta remains in the body (incomplete miscarriage), tissue left behind may become infected and must be removed. Usually, a dilation and curettage (D and C) procedure is performed.  During a D and C procedure, the cervix is widened (dilated) and any remaining fetal or placental tissue is gently removed from the uterus.  Antibiotic medicines are prescribed if there is an infection. Other medicines may be given to reduce the size of the uterus (contract) if there is a lot of bleeding.  If you have Rh negative blood and your baby was Rh positive, you will need a Rh immunoglobulin shot. This shot will protect any future baby from having Rh blood problems in future pregnancies. HOME CARE INSTRUCTIONS   Your caregiver may order bed rest or may allow you to continue light activity. Resume activity as directed by your caregiver.  Have someone help with home and family responsibilities during this time.   Keep track of the number of sanitary pads you use each day and how soaked (saturated) they are. Write down this information.   Do not use tampons. Do not douche or have sexual intercourse until approved by your caregiver.   Only take over-the-counter or prescription medicines for pain or discomfort as directed by your caregiver.   Do not take aspirin. Aspirin can cause bleeding.   Keep all follow-up appointments with your caregiver.   If you or your partner have problems with grieving, talk to your caregiver or seek counseling to help cope with the pregnancy loss. Allow enough time to grieve before trying to get pregnant again.  SEEK IMMEDIATE MEDICAL CARE IF:   You have severe cramps or pain in your back or abdomen.  You have a fever.  You pass large blood clots (walnut-sized   or larger) ortissue from your vagina. Save any tissue for your caregiver to inspect.   Your bleeding increases.   You have a thick, bad-smelling vaginal discharge.  You become lightheaded, weak, or you faint.   You have chills.  MAKE SURE YOU:  Understand these instructions.  Will watch your condition.  Will get help right away if you are not doing well or get  worse. Document Released: 01/25/2001 Document Revised: 11/26/2012 Document Reviewed: 09/20/2011 Exit[employer] Patient Information 2015 [employer]. This information is not intended to replace advice given to you by your health care provider. Make sure you discuss any questions you have with your health care provider.  

## 2014-07-18 NOTE — Progress Notes (Signed)
Small amount started last Wednesday, this Wednesday the bleeding and cramping increased to a moderate amount of blood.  She is still bleeding like that changing pad every four to five hours.

## 2014-07-19 LAB — HCG, QUANTITATIVE, PREGNANCY: HCG, BETA CHAIN, QUANT, S: 203.1 m[IU]/mL

## 2014-07-21 ENCOUNTER — Other Ambulatory Visit (INDEPENDENT_AMBULATORY_CARE_PROVIDER_SITE_OTHER): Payer: 59

## 2014-07-21 DIAGNOSIS — O200 Threatened abortion: Secondary | ICD-10-CM

## 2014-07-21 NOTE — Progress Notes (Signed)
Patient here today to have another beta HCG.

## 2014-07-21 NOTE — Progress Notes (Signed)
History Seen at [facility] for bleeding.  Found to have small gestational sac near cervix and thought to be miscarrying. Has h/o SAB previously and ectopic.  BHCG was 185, no yolk sac or fetal pole noted.  Further bleeding and cramping noted.  Here for repeat BHCG today.  Physical exam Filed Vitals:   07/18/14 0954  BP: 111/69  Pulse: 90   Appears uncomfortable. Abdomen is soft, appropriately tender, no rebound  Assessment Pregnancy of unknown location  Plan BHCG today, f/u per results.

## 2014-07-22 ENCOUNTER — Encounter (HOSPITAL_COMMUNITY): Payer: Self-pay | Admitting: *Deleted

## 2014-07-22 ENCOUNTER — Inpatient Hospital Stay (HOSPITAL_COMMUNITY): Payer: 59

## 2014-07-22 ENCOUNTER — Inpatient Hospital Stay (HOSPITAL_COMMUNITY)
Admission: AD | Admit: 2014-07-22 | Discharge: 2014-07-22 | Disposition: A | Discharge status: Home / Self Care | Payer: 59 | Source: Ambulatory Visit | Attending: Obstetrics and Gynecology | Admitting: Obstetrics and Gynecology

## 2014-07-22 ENCOUNTER — Other Ambulatory Visit: Payer: Self-pay | Admitting: Family Medicine

## 2014-07-22 DIAGNOSIS — O209 Hemorrhage in early pregnancy, unspecified: Secondary | ICD-10-CM

## 2014-07-22 DIAGNOSIS — O26891 Other specified pregnancy related conditions, first trimester: Secondary | ICD-10-CM

## 2014-07-22 DIAGNOSIS — R109 Unspecified abdominal pain: Secondary | ICD-10-CM | POA: Diagnosis not present

## 2014-07-22 DIAGNOSIS — O001 Tubal pregnancy: Secondary | ICD-10-CM | POA: Insufficient documentation

## 2014-07-22 DIAGNOSIS — N839 Noninflammatory disorder of ovary, fallopian tube and broad ligament, unspecified: Secondary | ICD-10-CM | POA: Insufficient documentation

## 2014-07-22 DIAGNOSIS — F17210 Nicotine dependence, cigarettes, uncomplicated: Secondary | ICD-10-CM | POA: Diagnosis not present

## 2014-07-22 DIAGNOSIS — N898 Other specified noninflammatory disorders of vagina: Secondary | ICD-10-CM

## 2014-07-22 DIAGNOSIS — O26899 Other specified pregnancy related conditions, unspecified trimester: Secondary | ICD-10-CM

## 2014-07-22 DIAGNOSIS — O9989 Other specified diseases and conditions complicating pregnancy, childbirth and the puerperium: Secondary | ICD-10-CM | POA: Diagnosis present

## 2014-07-22 DIAGNOSIS — Z3A09 9 weeks gestation of pregnancy: Secondary | ICD-10-CM | POA: Diagnosis not present

## 2014-07-22 DIAGNOSIS — O99331 Smoking (tobacco) complicating pregnancy, first trimester: Secondary | ICD-10-CM | POA: Diagnosis not present

## 2014-07-22 DIAGNOSIS — O00209 Unspecified ovarian pregnancy without intrauterine pregnancy: Secondary | ICD-10-CM

## 2014-07-22 LAB — COMPREHENSIVE METABOLIC PANEL
ALBUMIN: 3.6 g/dL (ref 3.5–5.2)
ALT: 7 U/L (ref 0–35)
ANION GAP: 12 (ref 5–15)
AST: 13 U/L (ref 0–37)
Alkaline Phosphatase: 67 U/L (ref 39–117)
BUN: 10 mg/dL (ref 6–23)
CO2: 24 mEq/L (ref 19–32)
Calcium: 9.4 mg/dL (ref 8.4–10.5)
Chloride: 101 mEq/L (ref 96–112)
Creatinine, Ser: 0.68 mg/dL (ref 0.50–1.10)
GFR calc Af Amer: >90 mL/min (ref >90)
GFR calc non Af Amer: >90 mL/min (ref >90)
Glucose, Bld: 82 mg/dL (ref 70–99)
POTASSIUM: 4.1 meq/L (ref 3.7–5.3)
Sodium: 137 mEq/L (ref 137–147)
TOTAL PROTEIN: 7 g/dL (ref 6.0–8.3)
Total Bilirubin: 0.3 mg/dL (ref 0.3–1.2)

## 2014-07-22 LAB — CBC
HCT: 34.2 % — ABNORMAL LOW (ref 36.0–46.0)
Hemoglobin: 11.1 g/dL — ABNORMAL LOW (ref 12.0–15.0)
MCH: 21.9 pg — ABNORMAL LOW (ref 26.0–34.0)
MCHC: 32.5 g/dL (ref 30.0–36.0)
MCV: 67.5 fL — ABNORMAL LOW (ref 78.0–100.0)
Platelets: 251 10*3/uL (ref 150–400)
RBC: 5.07 MIL/uL (ref 3.87–5.11)
RDW: 19.6 % — AB (ref 11.5–15.5)
WBC: 8.1 10*3/uL (ref 4.0–10.5)

## 2014-07-22 LAB — HCG, QUANTITATIVE, PREGNANCY
HCG, BETA CHAIN, QUANT, S: 202.7 m[IU]/mL
HCG, BETA CHAIN, QUANT, S: 180 m[IU]/mL — AB (ref <5)

## 2014-07-22 MED ORDER — PROMETHAZINE HCL 12.5 MG PO TABS: 12.5000 mg | ORAL_TABLET | Freq: Four times a day (QID) | ORAL | Status: DC | PRN

## 2014-07-22 MED ORDER — HYDROCODONE-ACETAMINOPHEN 5-325 MG PO TABS: ORAL_TABLET | ORAL | Status: DC

## 2014-07-22 MED ORDER — ACETAMINOPHEN-CODEINE 300-30 MG PO TABS: 1.0000 | ORAL_TABLET | Freq: Four times a day (QID) | ORAL | Status: DC | PRN

## 2014-07-22 MED ORDER — METHOTREXATE INJECTION FOR WOMEN'S HOSPITAL
50.0000 mg/m2 | Freq: Once | INTRAMUSCULAR | Status: AC
  Administered 2014-07-22: 110 mg via INTRAMUSCULAR
  Filled 2014-07-22: qty 2.2

## 2014-07-22 MED ORDER — KETOROLAC TROMETHAMINE 60 MG/2ML IM SOLN
60.0000 mg | Freq: Once | INTRAMUSCULAR | Status: AC
  Administered 2014-07-22: 60 mg via INTRAMUSCULAR
  Filled 2014-07-22: qty 2

## 2014-07-22 NOTE — MAU Provider Note (Signed)
History     CSN: [id number]  Arrival date and time: 07/22/14 1043   First Provider Initiated Contact with Patient 07/22/14 1112      Chief Complaint  Patient presents with  . Abdominal Pain   HPI  Pt is a 33 yo [id number] at [redacted]w[redacted]d wks pregnancy called and informed to come to MAU for follow-up.  Ultrasound was completed on 07/16/14 when patient presented for vaginal bleeding.  Ultrasound showed the following:  IMPRESSION: 1. Suspect spontaneous abortion in progress, with a small irregular elongated collection of fluid at the lower uterine segment. Uterus otherwise unremarkable in appearance. 2. Small amount of free fluid within the pelvic cul-de-sac is mildly complex in appearance, of uncertain significance. No definite evidence of ectopic pregnancy. If the patient's quantitative beta HCG level continues to trend upward, follow-up pelvic ultrasound could be considered as deemed clinically appropriate.BHCG was completed and it was 185.  Repeat BHCG on 12/4 was 203 and 12/7 202.7.    Light cramping and bleeding today.    Past Medical History  Diagnosis Date  . Fibroids   . Ectopic pregnancy     Past Surgical History  Procedure Laterality Date  . Dilation and curettage of uterus  2003  . Ectopic pregnancy surgery      Family History  Problem Relation Age of Onset  . Cirrhosis Father   . Kidney failure Father     History  Substance Use Topics  . Smoking status: Current Every Day Smoker -- 0.25 packs/day    Types: Cigarettes  . Smokeless tobacco: Never Used  . Alcohol Use: No     Comment: socially    Allergies:  Allergies  Allergen Reactions  . Iodine Swelling    Tongue swelling    Prescriptions prior to admission  Medication Sig Dispense Refill Last Dose  . HYDROcodone-acetaminophen (NORCO/VICODIN) 5-325 MG per tablet 1 to 2 tabs every 4 to 6 hours as needed for pain. 30 tablet 0 07/22/2014 at Unknown time  . promethazine (PHENERGAN) 25 MG tablet Take 1 tablet  (25 mg total) by mouth every 6 (six) hours as needed for nausea or vomiting. 30 tablet 0 Taking    Review of Systems  Gastrointestinal: Positive for nausea and abdominal pain (cramping). Negative for vomiting.  Genitourinary:       Light bleeding  All other systems reviewed and are negative.  Physical Exam   Blood pressure 127/81, pulse 88, temperature 99.1 F (37.3 C), temperature source Oral, resp. rate 18, height 5' 6.5" (1.689 m), weight 103.42 kg (228 lb), last menstrual period 05/16/2014.  Physical Exam  Constitutional: She is oriented to person, place, and time. She appears well-developed and well-nourished. No distress.  HENT:  Head: Normocephalic.  Neck: Normal range of motion. Neck supple.  Cardiovascular: Normal rate, regular rhythm and normal heart sounds.   Respiratory: Effort normal and breath sounds normal. No respiratory distress.  GI: Soft. There is tenderness.  Genitourinary: Uterus is not enlarged. Right adnexum displays no mass and no tenderness. Left adnexum displays tenderness. Left adnexum displays no mass. There is bleeding in the vagina.  Scant, blood-tinged mucus  Musculoskeletal: Normal range of motion.  Neurological: She is alert and oriented to person, place, and time.  Skin: Skin is warm and dry.    MAU Course  Procedures Results for orders placed or performed during the hospital encounter of 07/22/14 (from the past 24 hour(s))  hCG, quantitative, pregnancy     Status: Abnormal   Collection Time: 07/22/14  1:27 PM  Result Value Ref Range   hCG, Beta Chain, Quant, S 180 (H) <5 mIU/mL  CBC     Status: Abnormal   Collection Time: 07/22/14  1:33 PM  Result Value Ref Range   WBC 8.1 4.0 - 10.5 K/uL   RBC 5.07 3.87 - 5.11 MIL/uL   Hemoglobin 11.1 (L) 12.0 - 15.0 g/dL   HCT 34.2 (L) 36.0 - 46.0 %   MCV 67.5 (L) 78.0 - 100.0 fL   MCH 21.9 (L) 26.0 - 34.0 pg   MCHC 32.5 30.0 - 36.0 g/dL   RDW 19.6 (H) 11.5 - 15.5 %   Platelets 251 150 - 400 K/uL   Comprehensive metabolic panel     Status: None   Collection Time: 07/22/14  1:33 PM  Result Value Ref Range   Sodium 137 137 - 147 mEq/L   Potassium 4.1 3.7 - 5.3 mEq/L   Chloride 101 96 - 112 mEq/L   CO2 24 19 - 32 mEq/L   Glucose, Bld 82 70 - 99 mg/dL   BUN 10 6 - 23 mg/dL   Creatinine, Ser 0.68 0.50 - 1.10 mg/dL   Calcium 9.4 8.4 - 10.5 mg/dL   Total Protein 7.0 6.0 - 8.3 g/dL   Albumin 3.6 3.5 - 5.2 g/dL   AST 13 0 - 37 U/L   ALT 7 0 - 35 U/L   Alkaline Phosphatase 67 39 - 117 U/L   Total Bilirubin 0.3 0.3 - 1.2 mg/dL   GFR calc non Af Amer >90 >90 mL/min   GFR calc Af Amer >90 >90 mL/min   Anion gap 12 5 - 15    1125 Consulted with Dr. Elly Modena > reviewed HPI/prior labs and ultrasound > repeat ultrasound, if nonviable pregnancy may offer cytotec  Ultrasound: IMPRESSION: No intrauterine gestational sac visualized.  1.3 cm hypoechoic mass adjacent to right ovary, which is suspicious for an ectopic pregnancy. No evidence of free fluid.  Critical Value/emergent results were called by telephone at the time of interpretation on 07/22/2014 at 1:07 pm to patient's caretaker in MAU, Manuela Schwartz, who verbally acknowledged these results.  [place] with Dr. Elly Modena > reviewed ultrasound result > offer methotrexate   Assessment and Plan  Right paraovarian mass - suspected ectopic  Plan: Discharge to home Return on Friday for repeat BHCG Reviewed ectopic precautions RX Tylenol#3  Sharyon [facility]us [facility] N 07/22/2014, 11:14 AM

## 2014-07-22 NOTE — Progress Notes (Signed)
Quick Note:  Patient has plateauing HCG levels which is worrisome for ectopic pregnancy.  07/16/2014 185  07/18/2014 203.1 07/21/2014 202.7 Patient needs to be counseled about this concern and sent to MAU to receive methotrexate and further counseling.    Verita Schneiders, MD, [place] Attending [place] for [place]  ______

## 2014-07-22 NOTE — Progress Notes (Signed)
Reviewed Mtx precautions and F/U labs.

## 2014-07-22 NOTE — MAU Note (Signed)
Received phone call from Dr Kennon Rounds, was told to come in NOW.  Pregnancy hormone has leveled, concern is for ectopic preg.  Here for testing and [place]. Pain in lower abd- cramping

## 2014-07-23 ENCOUNTER — Telehealth: Payer: Self-pay | Admitting: *Deleted

## 2014-07-23 ENCOUNTER — Encounter: Payer: Self-pay | Admitting: Obstetrics & Gynecology

## 2014-07-23 ENCOUNTER — Inpatient Hospital Stay (HOSPITAL_COMMUNITY): Payer: 59 | Admitting: Anesthesiology

## 2014-07-23 ENCOUNTER — Ambulatory Visit (HOSPITAL_COMMUNITY)
Admission: AD | Admit: 2014-07-23 | Discharge: 2014-07-23 | Disposition: A | Discharge status: Home / Self Care | Payer: 59 | Source: Ambulatory Visit | Attending: Obstetrics & Gynecology | Admitting: Obstetrics & Gynecology

## 2014-07-23 ENCOUNTER — Inpatient Hospital Stay (HOSPITAL_COMMUNITY): Payer: 59

## 2014-07-23 ENCOUNTER — Encounter (HOSPITAL_COMMUNITY)
Admission: AD | Disposition: A | Discharge status: Home / Self Care | Payer: Self-pay | Source: Ambulatory Visit | Attending: Obstetrics & Gynecology

## 2014-07-23 ENCOUNTER — Encounter (HOSPITAL_COMMUNITY): Payer: Self-pay | Admitting: General Practice

## 2014-07-23 DIAGNOSIS — O009 Ectopic pregnancy, unspecified: Secondary | ICD-10-CM | POA: Insufficient documentation

## 2014-07-23 DIAGNOSIS — Z720 Tobacco use: Secondary | ICD-10-CM | POA: Diagnosis not present

## 2014-07-23 DIAGNOSIS — O26899 Other specified pregnancy related conditions, unspecified trimester: Secondary | ICD-10-CM

## 2014-07-23 DIAGNOSIS — O001 Tubal pregnancy: Secondary | ICD-10-CM

## 2014-07-23 DIAGNOSIS — R109 Unspecified abdominal pain: Secondary | ICD-10-CM

## 2014-07-23 HISTORY — PX: DIAGNOSTIC LAPAROSCOPY WITH REMOVAL OF ECTOPIC PREGNANCY: SHX6449

## 2014-07-23 LAB — CBC
HCT: 34 % — ABNORMAL LOW (ref 36.0–46.0)
HEMOGLOBIN: 11 g/dL — AB (ref 12.0–15.0)
MCH: 21.7 pg — ABNORMAL LOW (ref 26.0–34.0)
MCHC: 32.4 g/dL (ref 30.0–36.0)
MCV: 67.2 fL — ABNORMAL LOW (ref 78.0–100.0)
Platelets: 262 10*3/uL (ref 150–400)
RBC: 5.06 MIL/uL (ref 3.87–5.11)
RDW: 19.8 % — ABNORMAL HIGH (ref 11.5–15.5)
WBC: 7.4 10*3/uL (ref 4.0–10.5)

## 2014-07-23 LAB — HCG, QUANTITATIVE, PREGNANCY: hCG, Beta Chain, Quant, S: 219 m[IU]/mL — ABNORMAL HIGH (ref <5)

## 2014-07-23 LAB — TYPE AND SCREEN
ABO/RH(D): O POS
Antibody Screen: NEGATIVE

## 2014-07-23 SURGERY — LAPAROSCOPY, WITH ECTOPIC PREGNANCY SURGICAL TREATMENT
Anesthesia: General | Site: Abdomen | Laterality: Right

## 2014-07-23 MED ORDER — SCOPOLAMINE 1 MG/3DAYS TD PT72
1.0000 | MEDICATED_PATCH | TRANSDERMAL | Status: DC
  Administered 2014-07-23: 1.5 mg via TRANSDERMAL

## 2014-07-23 MED ORDER — HYDROMORPHONE HCL 1 MG/ML IJ SOLN
INTRAMUSCULAR | Status: DC | PRN
  Administered 2014-07-23: 1 mg via INTRAVENOUS

## 2014-07-23 MED ORDER — SCOPOLAMINE 1 MG/3DAYS TD PT72
MEDICATED_PATCH | TRANSDERMAL | Status: AC
  Filled 2014-07-23: qty 1

## 2014-07-23 MED ORDER — GLYCOPYRROLATE 0.2 MG/ML IJ SOLN
INTRAMUSCULAR | Status: AC
  Filled 2014-07-23: qty 3

## 2014-07-23 MED ORDER — DEXAMETHASONE SODIUM PHOSPHATE 10 MG/ML IJ SOLN
INTRAMUSCULAR | Status: DC | PRN
  Administered 2014-07-23: 4 mg via INTRAVENOUS

## 2014-07-23 MED ORDER — FENTANYL CITRATE 0.05 MG/ML IJ SOLN
25.0000 ug | INTRAMUSCULAR | Status: DC | PRN
  Administered 2014-07-23 (×2): 50 ug via INTRAVENOUS

## 2014-07-23 MED ORDER — ACETAMINOPHEN 10 MG/ML IV SOLN
1000.0000 mg | Freq: Once | INTRAVENOUS | Status: DC
  Filled 2014-07-23: qty 100

## 2014-07-23 MED ORDER — OXYCODONE-ACETAMINOPHEN 5-325 MG PO TABS: 2.0000 | ORAL_TABLET | Freq: Once | ORAL | Status: AC | PRN

## 2014-07-23 MED ORDER — ROCURONIUM BROMIDE 100 MG/10ML IV SOLN
INTRAVENOUS | Status: DC | PRN
  Administered 2014-07-23: 25 mg via INTRAVENOUS
  Administered 2014-07-23: 5 mg via INTRAVENOUS

## 2014-07-23 MED ORDER — KETOROLAC TROMETHAMINE 30 MG/ML IJ SOLN
INTRAMUSCULAR | Status: AC
  Filled 2014-07-23: qty 1

## 2014-07-23 MED ORDER — OXYCODONE-ACETAMINOPHEN 5-325 MG PO TABS: 1.0000 | ORAL_TABLET | ORAL | Status: DC | PRN

## 2014-07-23 MED ORDER — IBUPROFEN 600 MG PO TABS: 600.0000 mg | ORAL_TABLET | Freq: Four times a day (QID) | ORAL | Status: DC | PRN

## 2014-07-23 MED ORDER — OXYCODONE-ACETAMINOPHEN 5-325 MG PO TABS: 1.0000 | ORAL_TABLET | Freq: Four times a day (QID) | ORAL | Status: DC | PRN

## 2014-07-23 MED ORDER — LACTATED RINGERS IR SOLN
Status: DC | PRN
  Administered 2014-07-23: 3000 mL

## 2014-07-23 MED ORDER — ROCURONIUM BROMIDE 100 MG/10ML IV SOLN
INTRAVENOUS | Status: AC
  Filled 2014-07-23: qty 1

## 2014-07-23 MED ORDER — LACTATED RINGERS IV BOLUS (SEPSIS)
1000.0000 mL | Freq: Once | INTRAVENOUS | Status: AC
  Administered 2014-07-23: 1000 mL via INTRAVENOUS

## 2014-07-23 MED ORDER — NEOSTIGMINE METHYLSULFATE 10 MG/10ML IV SOLN
INTRAVENOUS | Status: DC | PRN
  Administered 2014-07-23: 5 mg via INTRAVENOUS

## 2014-07-23 MED ORDER — FENTANYL CITRATE 0.05 MG/ML IJ SOLN
INTRAMUSCULAR | Status: AC
  Filled 2014-07-23: qty 5

## 2014-07-23 MED ORDER — FENTANYL CITRATE 0.05 MG/ML IJ SOLN
INTRAMUSCULAR | Status: AC
  Administered 2014-07-23: 50 ug via INTRAVENOUS
  Filled 2014-07-23: qty 2

## 2014-07-23 MED ORDER — PROMETHAZINE HCL 25 MG PO TABS: 25.0000 mg | ORAL_TABLET | Freq: Four times a day (QID) | ORAL | Status: DC | PRN

## 2014-07-23 MED ORDER — ONDANSETRON 8 MG PO TBDP
8.0000 mg | ORAL_TABLET | Freq: Once | ORAL | Status: AC
  Administered 2014-07-23: 8 mg via ORAL
  Filled 2014-07-23: qty 1

## 2014-07-23 MED ORDER — OXYCODONE-ACETAMINOPHEN 5-325 MG PO TABS
ORAL_TABLET | ORAL | Status: AC
  Administered 2014-07-23: 2 via ORAL
  Filled 2014-07-23: qty 2

## 2014-07-23 MED ORDER — HEPARIN SODIUM (PORCINE) 5000 UNIT/ML IJ SOLN
INTRAMUSCULAR | Status: AC
  Filled 2014-07-23: qty 1

## 2014-07-23 MED ORDER — NEOSTIGMINE METHYLSULFATE 10 MG/10ML IV SOLN
INTRAVENOUS | Status: AC
  Filled 2014-07-23: qty 1

## 2014-07-23 MED ORDER — FAMOTIDINE IN NACL 20-0.9 MG/50ML-% IV SOLN: 20.0000 mg | Freq: Once | INTRAVENOUS | Status: DC

## 2014-07-23 MED ORDER — DOCUSATE SODIUM 100 MG PO CAPS: 100.0000 mg | ORAL_CAPSULE | Freq: Two times a day (BID) | ORAL | Status: DC | PRN

## 2014-07-23 MED ORDER — MIDAZOLAM HCL 2 MG/2ML IJ SOLN
INTRAMUSCULAR | Status: DC | PRN
  Administered 2014-07-23: 2 mg via INTRAVENOUS

## 2014-07-23 MED ORDER — BUPIVACAINE HCL (PF) 0.25 % IJ SOLN
INTRAMUSCULAR | Status: AC
  Filled 2014-07-23: qty 30

## 2014-07-23 MED ORDER — HYDROMORPHONE HCL 1 MG/ML IJ SOLN
1.0000 mg | Freq: Once | INTRAMUSCULAR | Status: AC
  Administered 2014-07-23: 1 mg via INTRAVENOUS
  Filled 2014-07-23: qty 1

## 2014-07-23 MED ORDER — OXYCODONE-ACETAMINOPHEN 5-325 MG PO TABS
2.0000 | ORAL_TABLET | ORAL | Status: DC | PRN
  Administered 2014-07-23: 2 via ORAL

## 2014-07-23 MED ORDER — CEFAZOLIN SODIUM-DEXTROSE 2-3 GM-% IV SOLR
INTRAVENOUS | Status: DC | PRN
  Administered 2014-07-23: 2 g via INTRAVENOUS

## 2014-07-23 MED ORDER — LIDOCAINE HCL (CARDIAC) 20 MG/ML IV SOLN
INTRAVENOUS | Status: DC | PRN
  Administered 2014-07-23: 80 mg via INTRAVENOUS

## 2014-07-23 MED ORDER — BUPIVACAINE HCL (PF) 0.25 % IJ SOLN
INTRAMUSCULAR | Status: DC | PRN
  Administered 2014-07-23: 4 mL

## 2014-07-23 MED ORDER — PROPOFOL 10 MG/ML IV EMUL
INTRAVENOUS | Status: AC
  Filled 2014-07-23: qty 20

## 2014-07-23 MED ORDER — DEXAMETHASONE SODIUM PHOSPHATE 10 MG/ML IJ SOLN
INTRAMUSCULAR | Status: AC
  Filled 2014-07-23: qty 1

## 2014-07-23 MED ORDER — PROPOFOL 10 MG/ML IV BOLUS
INTRAVENOUS | Status: DC | PRN
Start: 2014-07-23 — End: 2014-07-23
  Administered 2014-07-23: 200 mg via INTRAVENOUS

## 2014-07-23 MED ORDER — CEFAZOLIN SODIUM-DEXTROSE 2-3 GM-% IV SOLR
INTRAVENOUS | Status: AC
  Filled 2014-07-23: qty 50

## 2014-07-23 MED ORDER — SUCCINYLCHOLINE CHLORIDE 20 MG/ML IJ SOLN
INTRAMUSCULAR | Status: DC | PRN
  Administered 2014-07-23: 120 mg via INTRAVENOUS

## 2014-07-23 MED ORDER — GLYCOPYRROLATE 0.2 MG/ML IJ SOLN
INTRAMUSCULAR | Status: DC | PRN
  Administered 2014-07-23: .8 mg via INTRAVENOUS
  Administered 2014-07-23: 0.2 mg via INTRAVENOUS

## 2014-07-23 MED ORDER — ONDANSETRON HCL 4 MG/2ML IJ SOLN
INTRAMUSCULAR | Status: AC
  Filled 2014-07-23: qty 2

## 2014-07-23 MED ORDER — LIDOCAINE HCL (CARDIAC) 20 MG/ML IV SOLN
INTRAVENOUS | Status: AC
  Filled 2014-07-23: qty 5

## 2014-07-23 MED ORDER — GLYCOPYRROLATE 0.2 MG/ML IJ SOLN
INTRAMUSCULAR | Status: AC
  Filled 2014-07-23: qty 1

## 2014-07-23 MED ORDER — HYDROMORPHONE HCL 1 MG/ML IJ SOLN
INTRAMUSCULAR | Status: AC
  Filled 2014-07-23: qty 1

## 2014-07-23 MED ORDER — LACTATED RINGERS IV SOLN
INTRAVENOUS | Status: DC | PRN
  Administered 2014-07-23 (×3): via INTRAVENOUS

## 2014-07-23 MED ORDER — CITRIC ACID-SODIUM CITRATE 334-500 MG/5ML PO SOLN: 30.0000 mL | Freq: Once | ORAL | Status: DC

## 2014-07-23 MED ORDER — FENTANYL CITRATE 0.05 MG/ML IJ SOLN
INTRAMUSCULAR | Status: DC | PRN
  Administered 2014-07-23: 100 ug via INTRAVENOUS
  Administered 2014-07-23: 50 ug via INTRAVENOUS
  Administered 2014-07-23: 100 ug via INTRAVENOUS

## 2014-07-23 MED ORDER — MIDAZOLAM HCL 2 MG/2ML IJ SOLN
INTRAMUSCULAR | Status: AC
  Filled 2014-07-23: qty 2

## 2014-07-23 SURGICAL SUPPLY — 28 items
BAG SPEC RTRVL LRG 6X4 10 (ENDOMECHANICALS)
BLADE SURG 11 STRL SS (BLADE) ×3 IMPLANT
CABLE HIGH FREQUENCY MONO STRZ (ELECTRODE) IMPLANT
CATH ROBINSON RED A/P 16FR (CATHETERS) ×3 IMPLANT
CHLORAPREP W/TINT 26ML (MISCELLANEOUS) ×2 IMPLANT
CLOTH BEACON ORANGE TIMEOUT ST (SAFETY) ×3 IMPLANT
CONT SPECI 4OZ STER CLIK (MISCELLANEOUS) IMPLANT
DURAPREP 26ML APPLICATOR (WOUND CARE) ×1 IMPLANT
GLOVE BIO SURGEON STRL SZ 6.5 (GLOVE) ×2 IMPLANT
GLOVE BIO SURGEONS STRL SZ 6.5 (GLOVE) ×1
GOWN STRL REUS W/TWL LRG LVL3 (GOWN DISPOSABLE) ×6 IMPLANT
LIQUID BAND (GAUZE/BANDAGES/DRESSINGS) ×5 IMPLANT
NS IRRIG 1000ML POUR BTL (IV SOLUTION) ×3 IMPLANT
PACK LAPAROSCOPY BASIN (CUSTOM PROCEDURE TRAY) ×3 IMPLANT
POUCH SPECIMEN RETRIEVAL 10MM (ENDOMECHANICALS) IMPLANT
PROTECTOR NERVE ULNAR (MISCELLANEOUS) ×5 IMPLANT
SCRUB PCMX 4 OZ (MISCELLANEOUS) ×6 IMPLANT
SET IRRIG TUBING LAPAROSCOPIC (IRRIGATION / IRRIGATOR) ×2 IMPLANT
SHEARS HARMONIC ACE PLUS 36CM (ENDOMECHANICALS) IMPLANT
SLEEVE SCD COMPRESS KNEE MED (MISCELLANEOUS) ×2 IMPLANT
SLEEVE XCEL OPT CAN 5 100 (ENDOMECHANICALS) ×2 IMPLANT
SUT MNCRL AB 4-0 PS2 18 (SUTURE) ×3 IMPLANT
SUT VICRYL 0 UR6 27IN ABS (SUTURE) ×2 IMPLANT
TOWEL OR 17X24 6PK STRL BLUE (TOWEL DISPOSABLE) ×6 IMPLANT
TRAY FOLEY CATH 14FR (SET/KITS/TRAYS/PACK) IMPLANT
TROCAR XCEL NON-BLD 11X100MML (ENDOMECHANICALS) ×2 IMPLANT
TROCAR XCEL NON-BLD 5MMX100MML (ENDOMECHANICALS) ×3 IMPLANT
WATER STERILE IRR 1000ML POUR (IV SOLUTION) ×3 IMPLANT

## 2014-07-23 NOTE — MAU Provider Note (Signed)
History     CSN: [id number]  Arrival date and time: 07/23/14 1606   First Provider Initiated Contact with Patient 07/23/14 1729      Chief Complaint  Patient presents with  . Shoulder Pain  . Back Pain   HPI Comments: Jessica Melton 33 y.o. [id number] [redacted]w[redacted]d presents to MAU with right shoulder pain that is 10/10 and started 2 hours ago.   Shoulder Pain   Back Pain  She has a known right ectopic; treated yesterday with MTX. She started experiencing stabbing right shoulder pain this afternoon. She attests to mild abdominal pain, however pain is the worst in her right shoulder.   OB History    Gravida Para Term Preterm AB TAB SAB Ectopic Multiple Living   4 1  1 2  1 1  1       Past Medical History  Diagnosis Date  . Fibroids   . Ectopic pregnancy     Past Surgical History  Procedure Laterality Date  . Dilation and curettage of uterus  2003  . Ectopic pregnancy surgery      Family History  Problem Relation Age of Onset  . Cirrhosis Father   . Kidney failure Father     History  Substance Use Topics  . Smoking status: Current Every Day Smoker -- 0.25 packs/day    Types: Cigarettes  . Smokeless tobacco: Never Used  . Alcohol Use: No     Comment: socially    Allergies:  Allergies  Allergen Reactions  . Iodine Swelling    Tongue swelling    Prescriptions prior to admission  Medication Sig Dispense Refill Last Dose  . Acetaminophen-Codeine (TYLENOL/CODEINE #3) 300-30 MG per tablet Take 1 tablet by mouth every 6 (six) hours as needed for pain. 20 tablet 0 07/23/2014 at Unknown time  . HYDROcodone-acetaminophen (NORCO/VICODIN) 5-325 MG per tablet 1 to 2 tabs every 4 to 6 hours as needed for pain. 30 tablet 0 Past Week at Unknown time  . promethazine (PHENERGAN) 12.5 MG tablet Take 1 tablet (12.5 mg total) by mouth every 6 (six) hours as needed for nausea or vomiting. 20 tablet 0 07/23/2014 at Unknown time  . promethazine (PHENERGAN) 25 MG tablet Take 1 tablet (25 mg  total) by mouth every 6 (six) hours as needed for nausea or vomiting. (Patient not taking: Reported on 07/23/2014) 30 tablet 0 Taking   Results for orders placed or performed during the hospital encounter of 07/23/14 (from the past 48 hour(s))  CBC     Status: Abnormal   Collection Time: 07/23/14  5:40 PM  Result Value Ref Range   WBC 7.4 4.0 - 10.5 K/uL   RBC 5.06 3.87 - 5.11 MIL/uL   Hemoglobin 11.0 (L) 12.0 - 15.0 g/dL   HCT 34.0 (L) 36.0 - 46.0 %   MCV 67.2 (L) 78.0 - 100.0 fL   MCH 21.7 (L) 26.0 - 34.0 pg   MCHC 32.4 30.0 - 36.0 g/dL   RDW 19.8 (H) 11.5 - 15.5 %   Platelets 262 150 - 400 K/uL  hCG, quantitative, pregnancy     Status: Abnormal   Collection Time: 07/23/14  5:40 PM  Result Value Ref Range   hCG, Beta Chain, Quant, S 219 (H) <5 mIU/mL    Comment:          GEST. AGE      CONC.  (mIU/mL)   <=1 WEEK        5 - 50     2  WEEKS       50 - 500     3 WEEKS       100 - 10,000     4 WEEKS     1,000 - 30,000     5 WEEKS     3,500 - 115,000   6-8 WEEKS     12,000 - 270,000    12 WEEKS     15,000 - 220,000        FEMALE AND NON-PREGNANT FEMALE:     LESS THAN 5 mIU/mL    US Ob Transvaginal  07/22/2014   CLINICAL DATA:  Inappropriate rising quantitative beta HCG levels. Pelvic pain.  EXAM: TRANSVAGINAL OB ULTRASOUND  TECHNIQUE: Transvaginal ultrasound was performed for complete evaluation of the gestation as well as the maternal uterus, adnexal regions, and pelvic cul-de-sac.  COMPARISON:  07/16/2014  FINDINGS: Uterus is retroverted. No intrauterine gestational sac seen on today's study. No fibroids identified.  Both ovaries are visualized. A small hypoechoic mass is seen adjacent to the right ovary which measures 1.2 x 1.3 cm and appears separate from the ovary. This is suspicious for an ectopic pregnancy. There is no evidence of free fluid.  IMPRESSION: No intrauterine gestational sac visualized.  1.3 cm hypoechoic mass adjacent to right ovary, which is suspicious for an ectopic  pregnancy. No evidence of free fluid.  Critical Value/emergent results were called by telephone at the time of interpretation on 07/22/2014 at 1:07 pm to patient's caretaker in MAU, Jessica Melton, who verbally acknowledged these results.   Electronically Signed   By: Earle Gell M.D.   On: 07/22/2014 13:09     Review of Systems  Musculoskeletal: Positive for back pain.   Physical Exam   Blood pressure 104/66, pulse 86, temperature 98.6 F (37 C), temperature source Oral, resp. rate 20, last menstrual period 05/16/2014.  Physical Exam  Constitutional: She is oriented to person, place, and time. She appears well-developed and well-nourished. She appears distressed.  Cardiovascular: Normal rate.   Respiratory: Effort normal and breath sounds normal.  GI: Normal appearance. There is generalized tenderness.  Musculoskeletal: Normal range of motion.  Neurological: She is alert and oriented to person, place, and time.  Skin: Skin is warm. She is not diaphoretic.  Psychiatric: Her behavior is normal.    MAU Course  Procedures  None  MDM Beta hcg  CBC [place] Dilaudid 1 mg IV    Beta hcg 12/8: 180 Beta hcg 12/9: 219  Patient unable to tolerate [place]; Dr. Harolyn Rutherford at bedside in [place]. Plan for OR.   Assessment and Plan   A: Right ectopic pregnancy with worsening pain S/P MTX Right shoulder pain   P: OR per Dr. Harolyn Rutherford  Diagnostic laparoscopy with possible right salpingectomy   Jessica Melton Jessica Lorino, NP 07/23/2014 6:45 PM

## 2014-07-23 NOTE — Anesthesia Procedure Notes (Signed)
Procedure Name: Intubation Date/Time: 07/23/2014 7:59 PM Performed by: Flossie Dibble Pre-anesthesia Checklist: Patient identified, Timeout performed, Emergency Drugs available, Suction available and Patient being monitored Patient Re-evaluated:Patient Re-evaluated prior to inductionOxygen Delivery Method: Circle system utilized Preoxygenation: Pre-oxygenation with 100% oxygen Intubation Type: IV induction, Rapid sequence and Cricoid Pressure applied Laryngoscope Size: Mac and 3 Grade View: Grade I Tube size: 7.0 mm Number of attempts: 1 Airway Equipment and Method: Stylet Placement Confirmation: ETT inserted through vocal cords under direct vision,  positive ETCO2 and breath sounds checked- equal and bilateral Secured at: 7 cm Tube secured with: Tape Dental Injury: Teeth and Oropharynx as per pre-operative assessment

## 2014-07-23 NOTE — Telephone Encounter (Signed)
Pt called and was seen in MAU yesterday and was told she has an ectopic pregnancy.  She called in saying she was having severe shoulder pain and wanted to know what to do.  I told patient to go straight to MAU because she could be having internal bleeding from the ectopic pregnancy.

## 2014-07-23 NOTE — MAU Note (Signed)
R shoulder pain & R side back pain started 1 1/2 hours ago.  Denies abd pain or bleeding.  Post MTX for ectopic.

## 2014-07-23 NOTE — Progress Notes (Signed)
Patient and her family are very apprehensive about possibility of losing her right fallopian tube given history of left sided ectopic in past.  She was told this will be done only if necessary.  Discussed possible impact on future fertility. To OR when ready.  Verita Schneiders, MD, [place] Attending [place], [facility]

## 2014-07-23 NOTE — Anesthesia Postprocedure Evaluation (Signed)
Anesthesia Post Note  Patient: Jessica Melton  Procedure(s) Performed: Procedure(s) (LRB): DIAGNOSTIC LAPAROSCOPY  (Right)  Anesthesia type: General  Patient location: PACU  Post pain: Pain level controlled  Post assessment: Post-op Vital signs reviewed  Last Vitals:  Filed Vitals:   07/23/14 2230  BP: 111/75  Pulse: 83  Temp:   Resp: 39    Post vital signs: Reviewed  Level of consciousness: sedated  Complications: No apparent anesthesia complications

## 2014-07-23 NOTE — Discharge Instructions (Signed)

## 2014-07-23 NOTE — Transfer of Care (Signed)
Immediate Anesthesia Transfer of Care Note  Patient: Jessica Melton  Procedure(s) Performed: Procedure(s): DIAGNOSTIC LAPAROSCOPY  (Right)  Patient Location: PACU  Anesthesia Type:General  Level of Consciousness: awake, alert , oriented, patient cooperative and responds to stimulation  Airway & Oxygen Therapy: Patient Spontanous Breathing and Patient connected to face mask  Post-op Assessment: Report given to PACU RN, Post -op Vital signs reviewed and stable and Patient moving all extremities X 4  Post vital signs: Reviewed and stable  Complications: No apparent anesthesia complications

## 2014-07-23 NOTE — Op Note (Signed)
[facility] PROCEDURE DATE: 07/23/2014  PREOPERATIVE DIAGNOSIS: Right ectopic pregnancy POSTOPERATIVE DIAGNOSIS: Ectopic pregnancy PROCEDURE: Diagnostic laparoscopy SURGEON:  Dr. Verita Schneiders ANESTHESIOLOGY TEAM: Anesthesiologist: Lyn Hollingshead, MD; Lyndle Herrlich, MD  CRNA: Flossie Dibble, CRNA  INDICATIONS: 33 y.o. [id number] at [redacted]w[redacted]d here with the preoperative diagnoses as listed above.  Please refer to preoperative notes for more details; patient came in with severe pain with conflicting findings on ultrasounds (see below). Patient was counseled regarding need for diagnostic laparoscopy and possible salpingectomy. Risks of surgery including bleeding which may require transfusion or reoperation, infection, injury to bowel or other surrounding organs, need for additional procedures including laparotomy and other postoperative/anesthesia complications were explained to patient.  Written informed consent was obtained.  FINDINGS:  Small amount of hemoperitoneum estimated to be about 50 ml of blood.  Normal appearing left and right fallopian tubes, no bleeding noted from either tube.  Small normal appearing uterus, normal bilateral ovaries.  ANESTHESIA: General INTRAVENOUS FLUIDS: 2000 ml ESTIMATED BLOOD LOSS: 50 ml URINE OUTPUT: 75 ml SPECIMENS: None COMPLICATIONS: None immediate  PROCEDURE IN DETAIL:  The patient was taken to the operating room where general anesthesia was administered and was found to be adequate.  She was placed in the dorsal lithotomy position, and was prepped and draped in a sterile manner.  A Foley catheter was inserted into her bladder and attached to constant drainage and a uterine manipulator was then advanced into the uterus.  After an adequate timeout was performed, attention was turned to the abdomen where an umbilical incision was made with the scalpel.  The Optiview 11-mm trocar and sleeve were then advanced without difficulty with the laparoscope under direct  visualization into the abdomen.  The abdomen was then insufflated with carbon dioxide gas and adequate pneumoperitoneum was obtained.  A left lower quadrant 5-mm ports was then placed under direct visualization and a blunt probe was used in the examination of the fallopian tubes in detail.   A survey of the patient's pelvis and abdomen revealed the findings above.  The Nezhat suction irrigator was then used to suction the hemoperitoneum and irrigate the pelvis. Further observation of the tubes revealed no bleeding. The abdomen was then desufflated, and all instruments were removed.  The fascial incision of the 10-mm site was reapproximated with a 0 Vicryl figure-of-eight stitch; and all skin incisions were closed with Dermabond. The patient tolerated the procedure well.  All instruments, needles, and sponge counts were correct x 2. The patient was taken to the recovery room in stable condition.   The patient will be discharged to home as per PACU criteria.  Routine postoperative instructions given.  She was prescribed Percocet, Ibuprofen and Colace.  She will follow up in the clinic in about 2 weeks for postoperative evaluation.   After Surgery Documentation: Differential diagnosis includes: early ectopic gestation that either aborted and caused the small amount of hemoperitoneum; or early ectopic that had small amount of hemorrhage but was still present in the tube and was not able to be visualized.  See ultrasound reports below:  07/22/2014   TRANSVAGINAL OB ULTRASOUND CLINICAL DATA:  Inappropriate rising quantitative beta HCG levels. Pelvic pain.    TECHNIQUE: Transvaginal ultrasound was performed for complete evaluation of the gestation as well as the maternal uterus, adnexal regions, and pelvic cul-de-sac.  COMPARISON:  07/16/2014  FINDINGS: Uterus is retroverted. No intrauterine gestational sac seen on today's study. No fibroids identified.  Both ovaries are visualized. A small hypoechoic mass is seen  adjacent to the right ovary which measures 1.2 x 1.3 cm and appears separate from the ovary. This is suspicious for an ectopic pregnancy. There is no evidence of free fluid.  IMPRESSION: No intrauterine gestational sac visualized.  1.3 cm hypoechoic mass adjacent to right ovary, which is suspicious for an ectopic pregnancy. No evidence of free fluid.  Critical Value/emergent results were called by telephone at the time of interpretation on 07/22/2014 at 1:07 pm to patient's caretaker in MAU, Manuela Schwartz, who verbally acknowledged these results.   Electronically Signed   By: Earle Gell M.D.   On: 07/22/2014 13:09   07/23/2014   TRANSVAGINAL OB ULTRASOUND CLINICAL DATA:  Abdominal pain and vaginal bleeding. Ectopic pregnancy identified on ultrasound dated 07/22/2014.   TECHNIQUE: Transvaginal ultrasound was performed for complete evaluation of the gestation as well as the maternal uterus, adnexal regions, and pelvic cul-de-sac.  COMPARISON:  07/22/2014  FINDINGS: Intrauterine gestational sac: No  Yolk sac:  No  Embryo:  No  Cardiac Activity: No  Heart Rate: No bpm  Maternal uterus/adnexae: The ectopic pregnancy identified on the prior exam is not visible today. The ovaries appear normal. There is a small amount of free fluid in the pelvis which is normal for a female of this age.  IMPRESSION: The ectopic pregnancy identified in the right adnexa on the prior study is not identified on the current exam. No intrauterine gestational sac.   Electronically Signed   By: Rozetta Nunnery M.D.   On: 07/23/2014 19:15   Patient received Methotrexate on 07/22/14; will need to follow up for Day 4 and Day 7 HCG checks as scheduled. Strict ectopic precautions reviewed with patient and her family prior to discharge to home.  Verita Schneiders, MD, [place] Attending [place], [facility]

## 2014-07-23 NOTE — Anesthesia Preprocedure Evaluation (Signed)
Anesthesia Evaluation  Patient identified by MRN, date of birth, ID band Patient awake    Reviewed: Allergy & Precautions, H&P , Patient's Chart, lab work & pertinent test results, reviewed documented beta blocker date and time   Airway Mallampati: II  TM Distance: >3 FB Neck ROM: full    Dental no notable dental hx.    Pulmonary Current Smoker,  breath sounds clear to auscultation  Pulmonary exam normal       Cardiovascular Rhythm:regular Rate:Normal     Neuro/Psych    GI/Hepatic   Endo/Other  Morbid obesity  Renal/GU      Musculoskeletal   Abdominal   Peds  Hematology   Anesthesia Other Findings NPO since 11:00  Reproductive/Obstetrics                             Anesthesia Physical Anesthesia Plan  ASA: III and emergent  Anesthesia Plan: General   Post-op Pain Management:    Induction: Intravenous  Airway Management Planned: Oral ETT  Additional Equipment:   Intra-op Plan:   Post-operative Plan: Extubation in OR  Informed Consent: I have reviewed the patients History and Physical, chart, labs and discussed the procedure including the risks, benefits and alternatives for the proposed anesthesia with the patient or authorized representative who has indicated his/her understanding and acceptance.   Dental Advisory Given and Dental advisory given  Plan Discussed with: CRNA and Surgeon  Anesthesia Plan Comments: (  Discussed general anesthesia, including possible nausea, instrumentation of airway, sore throat,pulmonary aspiration, etc. I asked if the were any outstanding questions, or  concerns before we proceeded. )        Anesthesia Quick Evaluation

## 2014-07-24 MED FILL — Heparin Sodium (Porcine) Inj 5000 Unit/ML: INTRAMUSCULAR | Qty: 1 | Status: AC

## 2014-07-26 ENCOUNTER — Inpatient Hospital Stay (HOSPITAL_COMMUNITY)
Admission: AD | Admit: 2014-07-26 | Discharge: 2014-07-26 | Disposition: A | Discharge status: Home / Self Care | Payer: 59 | Source: Ambulatory Visit | Attending: Family Medicine | Admitting: Family Medicine

## 2014-07-26 ENCOUNTER — Encounter (HOSPITAL_COMMUNITY): Payer: Self-pay | Admitting: *Deleted

## 2014-07-26 DIAGNOSIS — O001 Tubal pregnancy: Secondary | ICD-10-CM | POA: Insufficient documentation

## 2014-07-26 DIAGNOSIS — K5900 Constipation, unspecified: Secondary | ICD-10-CM | POA: Insufficient documentation

## 2014-07-26 DIAGNOSIS — O009 Unspecified ectopic pregnancy without intrauterine pregnancy: Secondary | ICD-10-CM

## 2014-07-26 LAB — HCG, QUANTITATIVE, PREGNANCY: HCG, BETA CHAIN, QUANT, S: 218 m[IU]/mL — AB (ref <5)

## 2014-07-26 MED ORDER — FLEET ENEMA 7-19 GM/118ML RE ENEM
1.0000 | ENEMA | Freq: Once | RECTAL | Status: AC
  Administered 2014-07-26: 1 via RECTAL

## 2014-07-26 NOTE — MAU Note (Signed)
PT  SAYS SHE WAS HERE   FOR  METHOTREXATE   IN PAST  BUT NOW  SAYS  PAIN   FROM NO BM  X2 WEEKS.

## 2014-07-26 NOTE — Discharge Instructions (Signed)

## 2014-07-26 NOTE — MAU Note (Signed)
Pt presents to MAU for repeat labs. Had surgery for an ectopic on December 9th

## 2014-07-26 NOTE — MAU Provider Note (Signed)
Subjective:  Jessica Melton is a 33 y.o. female who presents for day 4 beta hcg S/P MTX on 12/8. The patient was originally seen on 12/2 in MAU for vaginal bleeding, she was brought back for follow up on 12/8. US revealed a 1.3 cm hypoechoic mass adjacent to right ovary, which is suspicious for an ectopic pregnancy. No evidence of free fluid. The patient was given MTX. She presented again on 12/9 with severe right shoulder pain she was taken to the OR for a Diagnostic laparoscopy. Findings per OP noted:  FINDINGS: Small amount of hemoperitoneum estimated to be about 50 ml of blood. Normal appearing left and right fallopian tubes, no bleeding noted from either tube. Small normal appearing uterus, normal bilateral ovaries.  Patient presents today with complaints of continued pain. She currently rates her abdominal pain 8/10. She also states that she has not had a BM in 2 weeks. She tried Magnesium citrate today without results. She states that her dressing is intact and she denies fever. The pain is in her lower abdomen "all throughout my lower stomach". She states that she feels stool in her rectum but cannot get it out. Patient has taken a fair amount of pain medication and feels this has contributed to the constipation.    Objective:  GENERAL: Well-developed, well-nourished female in no acute distress.  HEENT: Normocephalic, atraumatic.   LUNGS: Effort normal HEART: Regular rate  SKIN: Warm, dry and without erythema ABDOMEN: dermabond intact, generalized tenderness throughout lower abdomen. No rigidity, no guarding.  PSYCH: Normal mood and affect  Filed Vitals:   07/26/14 1739  BP: 128/75  Pulse: 101  Temp: 98.7 F (37.1 C)  Resp: 20   MDM: Day 1 beta hcg: 180 Day 4 beta hcg: 218 Consulted with Dr .Kennon Rounds.  Fleets enema  Patient had a large BM in MAU; and states that she feels much better.    Assessment: Ectopic pregnancy; status post MTX Day 4 beta hcg labs  Constipation     Plan:  Discharge home in stable condition  Return on Tuesday (12/15) for Day 7 beta hcg Ectopic precautions Daily stool softer Metamucil/ miralax ok per package instructions Pelvic rest   Jessica Melton [name], NP 07/26/2014 7:11 PM

## 2014-07-29 ENCOUNTER — Ambulatory Visit (INDEPENDENT_AMBULATORY_CARE_PROVIDER_SITE_OTHER): Payer: 59 | Admitting: Family Medicine

## 2014-07-29 ENCOUNTER — Encounter: Payer: Self-pay | Admitting: Family Medicine

## 2014-07-29 VITALS — BP 111/77 | HR 99 | Wt 227.0 lb

## 2014-07-29 DIAGNOSIS — O00109 Unspecified tubal pregnancy without intrauterine pregnancy: Secondary | ICD-10-CM

## 2014-07-29 DIAGNOSIS — O001 Tubal pregnancy: Secondary | ICD-10-CM

## 2014-07-29 DIAGNOSIS — Z8759 Personal history of other complications of pregnancy, childbirth and the puerperium: Secondary | ICD-10-CM

## 2014-07-29 NOTE — Patient Instructions (Signed)
Methotrexate Treatment for an Ectopic Pregnancy °Methotrexate is a medicine that treats ectopic pregnancy by stopping the growth of the fertilized egg. It also helps your body absorb tissue from the egg. This takes between 2 weeks and 6 weeks. Most ectopic pregnancies can be successfully treated with methotrexate if they are detected early enough. °LET YOUR HEALTH CARE PROVIDER KNOW ABOUT: °· Any allergies you have. °· All medicines you are taking, including vitamins, herbs, eye drops, creams, and over-the-counter medicines. °· Medical conditions you have. °RISKS AND COMPLICATIONS °Generally, this is a safe treatment. However, as with any treatment, problems can occur. Possible problems or side effects include: °· Nausea. °· Vomiting. °· Diarrhea. °· Abdominal cramping. °· Mouth sores. °· Increased vaginal bleeding or spotting.   °· Swelling or irritation of the lining of your lungs (pneumonitis).  °· Failed treatment and continuation of the pregnancy.   °· Liver damage. °· Hair loss. °There is still a risk of the ectopic pregnancy rupturing while using the methotrexate. °BEFORE THE PROCEDURE °Before you take the medicine:  °· Liver tests, kidney tests, and a complete blood test are performed. °· Blood tests are performed to measure the pregnancy hormone levels and to determine your blood type. °· If you are Rh-negative and the father is Rh-positive or his Rh type is not known, you will be given a Rho (D) immune globulin shot. °PROCEDURE  °There are two methods that your health care provider may use to prescribe methotrexate. One method involves a single dose or injection of the medicine. Another method involves a series of doses given through several injections.  °AFTER THE PROCEDURE °· You may have some abdominal cramping, vaginal bleeding, and fatigue in the first few days after taking methotrexate. °· Blood tests will be taken for several weeks to check the pregnancy hormone levels. The blood tests are performed  until there is no more pregnancy hormone detected in the blood. °Document Released: 07/26/2001 Document Revised: 12/16/2013 Document Reviewed: 05/20/2013 °Exit[employer]® Patient Information ©2015 [employer]. This information is not intended to replace advice given to you by your health care provider. Make sure you discuss any questions you have with your health care provider. ° °

## 2014-07-29 NOTE — Progress Notes (Signed)
    Subjective:    Patient ID: Jessica Melton is a 33 y.o. female presenting with Follow-up  on 07/29/2014  HPI: Here for f/u of ectopic.  S/p MTX, then laparoscopy for hemoperitoneum, without removal of ectopic.  Her Day 4 BHCG was 218. Still having pain.  Also has had some constipation related to continued narcotic use.  Review of Systems  Constitutional: Negative for fever and chills.  Respiratory: Negative for shortness of breath.   Cardiovascular: Negative for chest pain.  Gastrointestinal: Positive for abdominal pain. Negative for nausea and vomiting.  Genitourinary: Negative for dysuria.  Skin: Negative for rash.      Objective:    BP 111/77 mmHg  Pulse 99  Wt 227 lb (102.967 kg)  LMP 05/16/2014 (Within Months)  Breastfeeding? Unknown Physical Exam  Constitutional: She is oriented to person, place, and time. She appears well-developed and well-nourished. No distress.  HENT:  Head: Normocephalic and atraumatic.  Eyes: No scleral icterus.  Neck: Neck supple.  Cardiovascular: Normal rate.   Pulmonary/Chest: Effort normal.  Neurological: She is alert and oriented to person, place, and time.        Assessment & Plan:   Problem List Items Addressed This Visit      Unprioritized   Tubal ectopic pregnancy - Primary    S/p MTX--day 7 BHCG today--may need to repeat methotrexate or continue to follow BHCG's.    Hx of ectopic pregnancy   Relevant Orders      hCG, quantitative, pregnancy       Return in about 1 week (around 08/05/2014).

## 2014-07-29 NOTE — Assessment & Plan Note (Signed)
S/p MTX--day 7 BHCG today--may need to repeat methotrexate or continue to follow BHCG's.

## 2014-07-30 ENCOUNTER — Inpatient Hospital Stay (HOSPITAL_COMMUNITY)
Admission: AD | Admit: 2014-07-30 | Discharge: 2014-07-30 | Disposition: A | Discharge status: Home / Self Care | Payer: 59 | Source: Ambulatory Visit | Attending: Obstetrics & Gynecology | Admitting: Obstetrics & Gynecology

## 2014-07-30 ENCOUNTER — Encounter (HOSPITAL_COMMUNITY): Payer: Self-pay | Admitting: General Practice

## 2014-07-30 DIAGNOSIS — O001 Tubal pregnancy: Secondary | ICD-10-CM | POA: Diagnosis not present

## 2014-07-30 DIAGNOSIS — O00109 Unspecified tubal pregnancy without intrauterine pregnancy: Secondary | ICD-10-CM

## 2014-07-30 LAB — HCG, QUANTITATIVE, PREGNANCY: hCG, Beta Chain, Quant, S: 204.3 m[IU]/mL

## 2014-07-30 MED ORDER — OXYCODONE-ACETAMINOPHEN 5-325 MG PO TABS: 1.0000 | ORAL_TABLET | Freq: Four times a day (QID) | ORAL | Status: DC | PRN

## 2014-07-30 MED ORDER — METHOTREXATE INJECTION FOR WOMEN'S HOSPITAL
50.0000 mg/m2 | Freq: Once | INTRAMUSCULAR | Status: AC
  Administered 2014-07-30: 110 mg via INTRAMUSCULAR
  Filled 2014-07-30: qty 2.2

## 2014-07-30 NOTE — Discharge Instructions (Signed)
Methotrexate Treatment for an Ectopic Pregnancy, Care After °Refer to this sheet in the next few weeks. These instructions provide you with information on caring for yourself after your procedure. Your health care provider may also give you more specific instructions. Your treatment has been planned according to current medical practices, but problems sometimes occur. Call your health care provider if you have any problems or questions after your procedure. °WHAT TO EXPECT AFTER THE PROCEDURE °You may have some abdominal cramping, vaginal bleeding, and fatigue in the first few days after taking methotrexate. Some other possible side effects of methotrexate include: °· Nausea. °· Vomiting. °· Diarrhea. °· Mouth sores. °· Swelling or irritation of the lining of your lungs (pneumonitis). °· Liver damage. °· Hair loss. °HOME CARE INSTRUCTIONS  °After you have received the methotrexate medicine, you need to be careful of your activities and watch your condition for several weeks. It may take 1 week before your hormone levels return to normal. °· Keep all follow-up appointments as directed by your health care provider. °· Avoid traveling too far away from your health care provider. °· Do not have sexual intercourse until your health care provider says it is safe to do so. °· You may resume your usual diet. °· Limit strenuous activity. °· Do not take folic acid, prenatal vitamins, or other vitamins that contain folic acid. °· Do not take aspirin, ibuprofen, or naproxen (nonsteroidal anti-inflammatory drugs [NSAIDs]). °· Do not drink alcohol. °SEEK MEDICAL CARE IF:  °· You cannot control your nausea and vomiting. °· You cannot control your diarrhea. °· You have sores in your mouth and want treatment. °· You need pain medicine for your abdominal pain. °· You have a rash. °· You are having a reaction to the medicine. °SEEK IMMEDIATE MEDICAL CARE IF:  °· You have increasing abdominal or pelvic pain. °· You notice increased  bleeding. °· You feel light-headed, or you faint. °· You have shortness of breath. °· Your heart rate increases. °· You have a cough. °· You have chills. °· You have a fever. °Document Released: 07/21/2011 Document Revised: 08/06/2013 Document Reviewed: 05/20/2013 °Exit[employer]® Patient Information ©2015 [employer]. This information is not intended to replace advice given to you by your health care provider. Make sure you discuss any questions you have with your health care provider. ° °

## 2014-07-30 NOTE — MAU Provider Note (Signed)
Patient ID: Taja Pentland is a 33 y.o. female [phone] presenting for methotrexate   HPI: Seen by Dr. Kennon Rounds at [facility] on 07/29/14 for f/u of ectopic. S/p MTX on 12/8 when US revealed 1.3 cm mass adjacent top right ovary. On 07/23/14 she presented with right shoulder pain and had laparoscopsy  for hemoperitoneum, without removal of ectopic. Her Day 4 BHCG was 218. Still having pain but it has subsided. Vaginal bleeding is scant.  Also has had some constipation related to continued narcotic use.  Review of Systems  Constitutional: Negative for fever and chills.  Respiratory: Negative for shortness of breath.  Cardiovascular: Negative for chest pain.  Gastrointestinal: Positive for abdominal pain. Negative for nausea and vomiting.  Genitourinary: Negative for dysuria.  Skin: Negative for rash.      Objective:    Filed Vitals:   07/30/14 1832  BP: 117/73  Pulse: 85  Temp: 98.6 F (37 C)  Resp: 18   Physical Exam  Constitutional: She is oriented to person, place, and time. She appears well-developed and well-nourished. No distress.  HENT:  Head: Normocephalic and atraumatic.  Eyes: No scleral icterus.  Neck: Neck supple.  Cardiovascular: Normal rate.  Pulmonary/Chest: Effort normal.  Neurological: She is alert and oriented to person, place, and time.    Notes Recorded by Erik Obey, CMA on 07/30/2014 at 9:25 AM Spoke to patient and she will go to MAU to receive the methotrexate injection per Dr. Kennon Rounds. Notes Recorded by Donnamae Jude, MD on 07/30/2014 at 9:07 AM Needs to come to MAU for repeat MTX       Ref Range 1d ago  4d ago  7d ago  8d ago  9d ago     hCG, Beta Chain, Quant, S mIU/mL 204.3 218 (H)R, CM 219 (H)R, CM 180 (H)R, CM 202.7CM      MAU Course Per Dr. Kennon Rounds, repeat labs not needed. MTX given Care assumed by Kerry Hough, PA at 2000. Lorene Dy, CNM 07/30/2014 8:00 PM   A: Ectopic pregnancy s/p MTX  P: Discharge home Patient given  second MTX dose today in MAU Ectopic precautions discussed Patient to return to MAU for day #4 08/02/14 for repeat labs Patient may return to MAU as needed or if her condition were to change or worsen   Luvenia Redden, PA-C 07/30/2014 8:20 PM

## 2014-07-30 NOTE — MAU Note (Signed)
Pt presents for repeat methotrexate injection. Pt complaining of lower abdominal pain. Pt still having vaginal bleeding as well.

## 2014-07-30 NOTE — MAU Note (Signed)
Urine in lab 

## 2014-08-02 ENCOUNTER — Inpatient Hospital Stay (HOSPITAL_COMMUNITY)
Admission: AD | Admit: 2014-08-02 | Discharge: 2014-08-02 | Disposition: A | Discharge status: Home / Self Care | Payer: 59 | Source: Ambulatory Visit | Attending: Obstetrics & Gynecology | Admitting: Obstetrics & Gynecology

## 2014-08-02 DIAGNOSIS — O001 Tubal pregnancy: Secondary | ICD-10-CM | POA: Diagnosis present

## 2014-08-02 LAB — HCG, QUANTITATIVE, PREGNANCY: HCG, BETA CHAIN, QUANT, S: 116 m[IU]/mL — AB (ref <5)

## 2014-08-02 MED ORDER — PROMETHAZINE HCL 25 MG PO TABS: 25.0000 mg | ORAL_TABLET | Freq: Four times a day (QID) | ORAL | Status: DC | PRN

## 2014-08-02 NOTE — MAU Note (Signed)
Fatima Blank CNM in Triage to discuss lab results and d/c plan. Pt d/c home from Triage.

## 2014-08-02 NOTE — MAU Provider Note (Signed)
S: 33 y.o. [id number] presents to MAU on Day 4 following second course of MTX for ectopic pregnancy.  She had laproscopic surgery prior to MTX but no removal of fallopian tubes because they appeared intact.  She reports cramping, nausea, and light bleeding, but no change in these symptoms in the last few days.  She denies dizziness or fever/chills.  O: BP 119/70 mmHg  Pulse 84  Temp(Src) 98.4 F (36.9 C)  Resp 18  Ht 5' 6.5" (1.689 m)  Wt 102.604 kg (226 lb 3.2 oz)  BMI 35.97 kg/m2  LMP 05/16/2014 (Within Months)  Physical Examination: General appearance - alert, well appearing, and in no distress, oriented to person, place, and time and acyanotic, in no respiratory distress  Results for orders placed or performed during the hospital encounter of 08/02/14 (from the past 168 hour(s))  hCG, quantitative, pregnancy   Collection Time: 08/02/14  7:35 PM  Result Value Ref Range   hCG, Beta Chain, Quant, S 116 (H) <5 mIU/mL  Results for orders placed or performed in visit on 07/29/14 (from the past 168 hour(s))  hCG, quantitative, pregnancy   Collection Time: 07/29/14 10:58 AM  Result Value Ref Range   hCG, Beta Chain, Quant, S 204.3 mIU/mL    A: Appropriate quant hcg on Day 4 following MTX Ectopic pregnancy  P: D/C home Return on Day 7 for repeat quant hcg Ectopic precautions given  Fatima Blank Certified Nurse-Midwife

## 2014-08-02 NOTE — MAU Note (Addendum)
Here for repeat BHCG. Has had 2 shots of MTX.Marland Kitchen Continues to have some bleeding which is the same as has been, some cramping which is the same. Nausea since 2nd MTX on Weds. Had lap about 2wks ago and pt states BHCG conts to rise. Pt still has honeycomb dsg from lap. OK to remove that now and keep area clean and dry. Understanding voiced

## 2014-08-04 ENCOUNTER — Other Ambulatory Visit: Payer: 59

## 2014-08-04 MED ORDER — ONDANSETRON 4 MG PO TBDP: 4.0000 mg | ORAL_TABLET | Freq: Four times a day (QID) | ORAL | Status: DC | PRN

## 2014-08-05 ENCOUNTER — Encounter (HOSPITAL_COMMUNITY): Payer: Self-pay | Admitting: Advanced Practice Midwife

## 2014-08-05 ENCOUNTER — Inpatient Hospital Stay (HOSPITAL_COMMUNITY)
Admission: AD | Admit: 2014-08-05 | Discharge: 2014-08-05 | Disposition: A | Discharge status: Home / Self Care | Payer: 59 | Source: Ambulatory Visit | Attending: Family Medicine | Admitting: Family Medicine

## 2014-08-05 ENCOUNTER — Inpatient Hospital Stay (HOSPITAL_COMMUNITY): Payer: 59

## 2014-08-05 DIAGNOSIS — O009 Unspecified ectopic pregnancy without intrauterine pregnancy: Secondary | ICD-10-CM

## 2014-08-05 DIAGNOSIS — F17210 Nicotine dependence, cigarettes, uncomplicated: Secondary | ICD-10-CM | POA: Insufficient documentation

## 2014-08-05 DIAGNOSIS — O001 Tubal pregnancy: Secondary | ICD-10-CM | POA: Diagnosis not present

## 2014-08-05 LAB — HCG, QUANTITATIVE, PREGNANCY: HCG, BETA CHAIN, QUANT, S: 118 m[IU]/mL — AB (ref <5)

## 2014-08-05 LAB — POCT PREGNANCY, URINE
Preg Test, Ur: NEGATIVE
Preg Test, Ur: POSITIVE — AB

## 2014-08-05 MED ORDER — HYDROMORPHONE HCL 1 MG/ML IJ SOLN
1.0000 mg | Freq: Once | INTRAMUSCULAR | Status: AC
  Administered 2014-08-05: 1 mg via INTRAMUSCULAR
  Filled 2014-08-05: qty 1

## 2014-08-05 NOTE — MAU Note (Signed)
Patient to MAU for follow up BHCG after 2nd MTX injection. States she continues to have abdominal pain and having bleeding like a light period.

## 2014-08-05 NOTE — Discharge Instructions (Signed)
Ectopic Pregnancy An ectopic pregnancy happens when a fertilized egg grows outside the uterus. A pregnancy cannot live outside of the uterus. This problem often happens in the fallopian tube. It is often caused by damage to the fallopian tube. If this problem is found early, you may be treated with medicine. If your tube tears or bursts open (ruptures), you will bleed inside. This is an emergency. You will need surgery. Get help right away.  SYMPTOMS You may have normal pregnancy symptoms at first. These include:  Missing your period.  Feeling sick to your stomach (nauseous).  Being tired.  Having tender breasts. Then, you may start to have symptoms that are not normal. These include:  Pain with sex (intercourse).  Bleeding from the vagina. This includes light bleeding (spotting).  Belly (abdomen) or lower belly cramping or pain. This may be felt on one side.  A fast heartbeat (pulse).  Passing out (fainting) after going poop (bowel movement). If your tube tears, you may have symptoms such as:  Really bad pain in the belly or lower belly. This happens suddenly.  Dizziness.  Passing out.  Shoulder pain. GET HELP RIGHT AWAY IF:  You have any of these symptoms. This is an emergency. MAKE SURE YOU:  Understand these instructions.  Will watch your condition.  Will get help right away if you are not doing well or get worse. Document Released: 10/28/2008 Document Revised: 08/06/2013 Document Reviewed: 03/13/2013 [facility] Patient Information 2015 [place], [place]. This information is not intended to replace advice given to you by your health care provider. Make sure you discuss any questions you have with your health care provider.

## 2014-08-05 NOTE — MAU Note (Signed)
Pt states she has had methotrexate injections x2. First 1 done and then pt had surgery the next day for a ruptured  ectopic pregnancy. Pt states she then had  2nd  Injection on Wednesday. Pt states she came back on Sat. For follow up quant day 4. And today for day 7. And [facility] is rising.

## 2014-08-05 NOTE — MAU Provider Note (Signed)
History     CSN: [id number]  Arrival date and time: 08/05/14 1214   None     Chief Complaint  Patient presents with  . Follow-up   HPI pt is Day 4 post 2nd day MTX here for repeat HCG level 12/2 HCG 185 12/4 HCG 203.1 12/7 HCG 202.7 12/8 dx right ectopic pregnancy- 1.3 cm mass adjacent/ top of right ovary HCG-180 12/9 Diag lap for right should pain and hemoperitoneum without removal of ectopic- Dr. Theresa Duty- 219 12/12 HCG 218 12/15 HCG 204.3 12/16- 2nd dose MTX 12/19 Day 4 2nd dose 116 12/22 Day 7 (today) HCG 118  RN note:  Registered Nurse Signed Nursing MAU Note 08/05/2014 1:08 PM    Expand All Collapse All   Patient to MAU for follow up BHCG after 2nd MTX injection. States she continues to have abdominal pain and having bleeding like a light period.     Results for Jessica Melton, Jessica Melton (MRN [id number]) as of 08/05/2014 13:52  Ref. Range 07/23/2014 19:03 07/26/2014 17:30 07/29/2014 10:58 08/02/2014 19:35 08/05/2014 12:24  hCG, Beta Chain, Quant, S Latest Range: <5 mIU/mL  218 (H) 204.3 116 (H) 118 (H)    Past Medical History  Diagnosis Date  . Fibroids   . Ectopic pregnancy     Left fallopian tube    Past Surgical History  Procedure Laterality Date  . Dilation and curettage of uterus  2003  . Ectopic pregnancy surgery    . Diagnostic laparoscopy with removal of ectopic pregnancy Right 07/23/2014    Procedure: DIAGNOSTIC LAPAROSCOPY ;  Surgeon: Osborne Oman, MD;  Location: [facility];  Service: Gynecology;  Laterality: Right;    Family History  Problem Relation Age of Onset  . Cirrhosis Father   . Kidney failure Father     History  Substance Use Topics  . Smoking status: Current Every Day Smoker -- 0.25 packs/day    Types: Cigarettes  . Smokeless tobacco: Never Used  . Alcohol Use: No     Comment: socially    Allergies:  Allergies  Allergen Reactions  . Iodine Swelling    Tongue swelling    Prescriptions prior to admission  Medication Sig  Dispense Refill Last Dose  . docusate sodium (COLACE) 100 MG capsule Take 1 capsule (100 mg total) by mouth 2 (two) times daily as needed. 30 capsule 2 Past Week at Unknown time  . ondansetron (ZOFRAN ODT) 4 MG disintegrating tablet Take 1 tablet (4 mg total) by mouth every 6 (six) hours as needed for nausea. 20 tablet 0   . oxyCODONE-acetaminophen (PERCOCET/ROXICET) 5-325 MG per tablet Take 1-2 tablets by mouth every 6 (six) hours as needed. 30 tablet 0   . promethazine (PHENERGAN) 25 MG tablet Take 1 tablet (25 mg total) by mouth every 6 (six) hours as needed for nausea or vomiting. 30 tablet 2     ROS Physical Exam   Blood pressure 106/79, pulse 80, temperature 99 F (37.2 C), temperature source Oral, resp. rate 16, last menstrual period 05/16/2014, SpO2 99 %, unknown if currently breastfeeding.  Physical Exam  MAU Course  Procedures Discussed with Dr. Nehemiah Settle- will come and see pt  Dr. Nehemiah Settle wants pt to have repeat urine pregnancy test and then have ultrasound if positive Pt had to leave before Dr. Nehemiah Settle available- boyfriend had to go to work Pt contacted by phone and will return - this afternoon. Assessment and Plan    LINEBERRY,SUSAN 08/05/2014, 1:50 PM   08/05/2014, 8:08 PM  Pt  returned to MAU, still having pain and light bleeding. Pt reports 8/10 pain, appears uncomfortable, had Percocet about 4 hours ago. States pain has not changed from previous visits. UPT positive, will repeat pelvic u/s. Dilaudid 1 mg IM prior to u/s for pain.   8:59 PM Care assumed by Hansel Feinstein, CNM  Returned from [place]. US Ob Comp Less 14 Wks  07/17/2014   CLINICAL DATA:  Acute onset of lower abdominal pain and vaginal bleeding. Initial encounter.  EXAM: OBSTETRIC <14 WK [place] AND TRANSVAGINAL OB US  TECHNIQUE: Both transabdominal and transvaginal ultrasound examinations were performed for complete evaluation of the gestation as well as the maternal uterus, adnexal regions, and pelvic cul-de-sac.  Transvaginal technique was performed to assess early pregnancy.  COMPARISON:  None.  FINDINGS: Intrauterine gestational sac: None seen.  Yolk sac:  N/A  Embryo:  N/A  Maternal uterus/adnexae: An irregular elongated small collection of fluid at the lower uterine segment may reflect a gestational sac. It measures approximately 0.6 cm in mean sac diameter. This raises suspicion for spontaneous abortion in progress.  No subchorionic hemorrhage is noted. The uterus is otherwise unremarkable in appearance.  The ovaries are within normal limits. The right ovary measures 4.2 x 2.0 x 1.8 cm, while the left ovary measures 4.6 x 2.2 x 4.0 cm. No suspicious adnexal masses are seen; there is no evidence for ovarian torsion.  A small amount of free fluid is noted within the pelvic cul-de-sac, mildly complex in appearance.  IMPRESSION: 1. Suspect spontaneous abortion in progress, with a small irregular elongated collection of fluid at the lower uterine segment. Uterus otherwise unremarkable in appearance. 2. Small amount of free fluid within the pelvic cul-de-sac is mildly complex in appearance, of uncertain significance. No definite evidence of ectopic pregnancy. If the patient's quantitative beta HCG level continues to trend upward, follow-up pelvic ultrasound could be considered as deemed clinically appropriate.   Electronically Signed   By: Garald Balding M.D.   On: 07/17/2014 00:00   US Ob Transvaginal  08/05/2014   CLINICAL DATA:  Followup ectopic pregnancy  EXAM: TRANSVAGINAL OB ULTRASOUND  TECHNIQUE: Transvaginal ultrasound was performed for complete evaluation of the gestation as well as the maternal uterus, adnexal regions, and pelvic cul-de-sac.  COMPARISON:  07/23/2014  FINDINGS: Maternal uterus/adnexae: There is no gestational sac. Uterus and endometrial stripe are within normal limits. Ovaries are unremarkable. No evidence of adnexal mass. Trace free fluid is nonspecific.  IMPRESSION: No evidence of ectopic  pregnancy or intrauterine gestation. Trace free fluid is nonspecific.   Electronically Signed   By: Maryclare Bean M.D.   On: 08/05/2014 21:13   US Ob Transvaginal  07/23/2014   CLINICAL DATA:  Abdominal pain and vaginal bleeding. Ectopic pregnancy identified on ultrasound dated 07/22/2014.  EXAM: TRANSVAGINAL OB ULTRASOUND  TECHNIQUE: Transvaginal ultrasound was performed for complete evaluation of the gestation as well as the maternal uterus, adnexal regions, and pelvic cul-de-sac.  COMPARISON:  07/22/2014  FINDINGS: Intrauterine gestational sac: No  Yolk sac:  No  Embryo:  No  Cardiac Activity: No  Heart Rate: No bpm  Maternal uterus/adnexae: The ectopic pregnancy identified on the prior exam is not visible today. The ovaries appear normal. There is a small amount of free fluid in the pelvis which is normal for a female of this age.  IMPRESSION: The ectopic pregnancy identified in the right adnexa on the prior study is not identified on the current exam. No intrauterine gestational sac.   Electronically Signed  By: Rozetta Nunnery M.D.   On: 07/23/2014 19:15   US Ob Transvaginal  07/22/2014   CLINICAL DATA:  Inappropriate rising quantitative beta HCG levels. Pelvic pain.  EXAM: TRANSVAGINAL OB ULTRASOUND  TECHNIQUE: Transvaginal ultrasound was performed for complete evaluation of the gestation as well as the maternal uterus, adnexal regions, and pelvic cul-de-sac.  COMPARISON:  07/16/2014  FINDINGS: Uterus is retroverted. No intrauterine gestational sac seen on today's study. No fibroids identified.  Both ovaries are visualized. A small hypoechoic mass is seen adjacent to the right ovary which measures 1.2 x 1.3 cm and appears separate from the ovary. This is suspicious for an ectopic pregnancy. There is no evidence of free fluid.  IMPRESSION: No intrauterine gestational sac visualized.  1.3 cm hypoechoic mass adjacent to right ovary, which is suspicious for an ectopic pregnancy. No evidence of free fluid.   Critical Value/emergent results were called by telephone at the time of interpretation on 07/22/2014 at 1:07 pm to patient's caretaker in MAU, Manuela Schwartz, who verbally acknowledged these results.   Electronically Signed   By: Earle Gell M.D.   On: 07/22/2014 13:09   US Ob Transvaginal  07/17/2014   CLINICAL DATA:  Acute onset of lower abdominal pain and vaginal bleeding. Initial encounter.  EXAM: OBSTETRIC <14 WK [place] AND TRANSVAGINAL OB US  TECHNIQUE: Both transabdominal and transvaginal ultrasound examinations were performed for complete evaluation of the gestation as well as the maternal uterus, adnexal regions, and pelvic cul-de-sac. Transvaginal technique was performed to assess early pregnancy.  COMPARISON:  None.  FINDINGS: Intrauterine gestational sac: None seen.  Yolk sac:  N/A  Embryo:  N/A  Maternal uterus/adnexae: An irregular elongated small collection of fluid at the lower uterine segment may reflect a gestational sac. It measures approximately 0.6 cm in mean sac diameter. This raises suspicion for spontaneous abortion in progress.  No subchorionic hemorrhage is noted. The uterus is otherwise unremarkable in appearance.  The ovaries are within normal limits. The right ovary measures 4.2 x 2.0 x 1.8 cm, while the left ovary measures 4.6 x 2.2 x 4.0 cm. No suspicious adnexal masses are seen; there is no evidence for ovarian torsion.  A small amount of free fluid is noted within the pelvic cul-de-sac, mildly complex in appearance.  IMPRESSION: 1. Suspect spontaneous abortion in progress, with a small irregular elongated collection of fluid at the lower uterine segment. Uterus otherwise unremarkable in appearance. 2. Small amount of free fluid within the pelvic cul-de-sac is mildly complex in appearance, of uncertain significance. No definite evidence of ectopic pregnancy. If the patient's quantitative beta HCG level continues to trend upward, follow-up pelvic ultrasound could be considered as deemed  clinically appropriate.   Electronically Signed   By: Garald Balding M.D.   On: 07/17/2014 00:00   Discussed with Dr Nehemiah Settle He recommends a third dose of Methotrexate  Discussed with patient She does not want another dose of Methotrexate Plan instead to have her return in 2 days for another HCG level Ectopic precautions  Seabron Spates, CNM

## 2014-08-07 ENCOUNTER — Inpatient Hospital Stay (HOSPITAL_COMMUNITY)
Admission: AD | Admit: 2014-08-07 | Discharge: 2014-08-07 | Disposition: A | Discharge status: Home / Self Care | Payer: 59 | Source: Ambulatory Visit | Attending: Obstetrics & Gynecology | Admitting: Obstetrics & Gynecology

## 2014-08-07 DIAGNOSIS — O001 Tubal pregnancy: Secondary | ICD-10-CM | POA: Insufficient documentation

## 2014-08-07 LAB — HCG, QUANTITATIVE, PREGNANCY: hCG, Beta Chain, Quant, S: 90 m[IU]/mL — ABNORMAL HIGH (ref <5)

## 2014-08-07 NOTE — MAU Provider Note (Signed)
Jessica Melton is a 33 y.o. [phone] who presents to the MAU for follow up Bhcg s/p MTX x2 for ectopic pregnancy. She denies pain and states she must leave and request results and plan of care be called to her. Stable for discharge without any symptoms at this time.  Blood drawn  @ 1920  Results for orders placed or performed during the hospital encounter of 08/07/14 (from the past 72 hour(s))  hCG, quantitative, pregnancy     Status: Abnormal   Collection Time: 08/07/14  6:27 PM  Result Value Ref Range   hCG, Beta Chain, Quant, S 90 (H) <5 mIU/mL    Comment:          GEST. AGE      CONC.  (mIU/mL)   <=1 WEEK        5 - 50     2 WEEKS       50 - 500     3 WEEKS       100 - 10,000     4 WEEKS     1,000 - 30,000     5 WEEKS     3,500 - 115,000   6-8 WEEKS     12,000 - 270,000    12 WEEKS     15,000 - 220,000        FEMALE AND NON-PREGNANT FEMALE:     LESS THAN 5 mIU/mL    I called patient with lab results and plan of care. Number dropped from 118 two days ago to 68 tonight. Patient to follow up in one week or sooner for any problems. Patient voices understanding and agrees with plan.

## 2014-08-07 NOTE — MAU Note (Signed)
Pt spoke with  NP after blood draw, said she needed to leave- they will call her with results.

## 2014-08-14 ENCOUNTER — Inpatient Hospital Stay (HOSPITAL_COMMUNITY)
Admission: AD | Admit: 2014-08-14 | Discharge: 2014-08-14 | Disposition: A | Discharge status: Home / Self Care | Payer: 59 | Source: Ambulatory Visit | Attending: Obstetrics and Gynecology | Admitting: Obstetrics and Gynecology

## 2014-08-14 ENCOUNTER — Inpatient Hospital Stay (HOSPITAL_COMMUNITY): Payer: 59

## 2014-08-14 ENCOUNTER — Encounter (HOSPITAL_COMMUNITY): Payer: Self-pay | Admitting: *Deleted

## 2014-08-14 DIAGNOSIS — O009 Unspecified ectopic pregnancy without intrauterine pregnancy: Secondary | ICD-10-CM

## 2014-08-14 DIAGNOSIS — O001 Tubal pregnancy: Secondary | ICD-10-CM | POA: Diagnosis not present

## 2014-08-14 DIAGNOSIS — R102 Pelvic and perineal pain: Secondary | ICD-10-CM

## 2014-08-14 DIAGNOSIS — N949 Unspecified condition associated with female genital organs and menstrual cycle: Secondary | ICD-10-CM | POA: Diagnosis not present

## 2014-08-14 DIAGNOSIS — F17210 Nicotine dependence, cigarettes, uncomplicated: Secondary | ICD-10-CM | POA: Insufficient documentation

## 2014-08-14 DIAGNOSIS — O26891 Other specified pregnancy related conditions, first trimester: Secondary | ICD-10-CM

## 2014-08-14 LAB — HCG, QUANTITATIVE, PREGNANCY: hCG, Beta Chain, Quant, S: 64 m[IU]/mL — ABNORMAL HIGH (ref <5)

## 2014-08-14 MED ORDER — HYDROMORPHONE HCL 1 MG/ML IJ SOLN
1.0000 mg | Freq: Once | INTRAMUSCULAR | Status: AC
  Administered 2014-08-14: 1 mg via INTRAMUSCULAR
  Filled 2014-08-14: qty 1

## 2014-08-14 NOTE — MAU Note (Signed)
For F/U MTX. Patient states she still has pain she rates a "7"/10. Still has light bleeding.

## 2014-08-14 NOTE — Discharge Instructions (Signed)
Ectopic Pregnancy An ectopic pregnancy happens when a fertilized egg grows outside the uterus. A pregnancy cannot live outside of the uterus. This problem often happens in the fallopian tube. It is often caused by damage to the fallopian tube. If this problem is found early, you may be treated with medicine. If your tube tears or bursts open (ruptures), you will bleed inside. This is an emergency. You will need surgery. Get help right away.  SYMPTOMS You may have normal pregnancy symptoms at first. These include:  Missing your period.  Feeling sick to your stomach (nauseous).  Being tired.  Having tender breasts. Then, you may start to have symptoms that are not normal. These include:  Pain with sex (intercourse).  Bleeding from the vagina. This includes light bleeding (spotting).  Belly (abdomen) or lower belly cramping or pain. This may be felt on one side.  A fast heartbeat (pulse).  Passing out (fainting) after going poop (bowel movement). If your tube tears, you may have symptoms such as:  Really bad pain in the belly or lower belly. This happens suddenly.  Dizziness.  Passing out.  Shoulder pain. GET HELP RIGHT AWAY IF:  You have any of these symptoms. This is an emergency. MAKE SURE YOU:  Understand these instructions.  Will watch your condition.  Will get help right away if you are not doing well or get worse. Document Released: 10/28/2008 Document Revised: 08/06/2013 Document Reviewed: 03/13/2013 [facility] Patient Information 2015 [place], [place]. This information is not intended to replace advice given to you by your health care provider. Make sure you discuss any questions you have with your health care provider.

## 2014-08-14 NOTE — MAU Provider Note (Signed)
History     CSN: [id number]  Arrival date and time: 08/14/14 1238   None     Chief Complaint  Patient presents with  . Follow-up    HPI  Jessica Melton is a 33 y.o. [phone] at Unknown who presents today for one week FU after second MTX injection. She reports some cramping and spotting that has continued. She rates her pain 7/10 at this time. She states that the pain is "a little bit better, but not much than it has been".   Past Medical History  Diagnosis Date  . Fibroids   . Ectopic pregnancy     Left fallopian tube    Past Surgical History  Procedure Laterality Date  . Dilation and curettage of uterus  2003  . Ectopic pregnancy surgery    . Diagnostic laparoscopy with removal of ectopic pregnancy Right 07/23/2014    Procedure: DIAGNOSTIC LAPAROSCOPY ;  Surgeon: Osborne Oman, MD;  Location: [facility];  Service: Gynecology;  Laterality: Right;    Family History  Problem Relation Age of Onset  . Cirrhosis Father   . Kidney failure Father     History  Substance Use Topics  . Smoking status: Current Every Day Smoker -- 0.25 packs/day    Types: Cigarettes  . Smokeless tobacco: Never Used  . Alcohol Use: No     Comment: socially    Allergies:  Allergies  Allergen Reactions  . Iodine Swelling    Tongue swelling    Prescriptions prior to admission  Medication Sig Dispense Refill Last Dose  . docusate sodium (COLACE) 100 MG capsule Take 1 capsule (100 mg total) by mouth 2 (two) times daily as needed. 30 capsule 2 Past Week at Unknown time  . ondansetron (ZOFRAN ODT) 4 MG disintegrating tablet Take 1 tablet (4 mg total) by mouth every 6 (six) hours as needed for nausea. 20 tablet 0   . oxyCODONE-acetaminophen (PERCOCET/ROXICET) 5-325 MG per tablet Take 1-2 tablets by mouth every 6 (six) hours as needed. 30 tablet 0   . promethazine (PHENERGAN) 25 MG tablet Take 1 tablet (25 mg total) by mouth every 6 (six) hours as needed for nausea or vomiting. 30 tablet 2      ROS Physical Exam   Blood pressure 119/76, pulse 92, temperature 99.2 F (37.3 C), temperature source Oral, resp. rate 18, last menstrual period 05/16/2014, SpO2 99 %, unknown if currently breastfeeding.  Physical Exam  Nursing note and vitals reviewed. Constitutional: She is oriented to person, place, and time. She appears well-developed and well-nourished. No distress.  Cardiovascular: Normal rate.   Respiratory: Effort normal.  GI: Soft. There is no tenderness.  Neurological: She is alert and oriented to person, place, and time.  Skin: Skin is warm and dry.  Psychiatric: She has a normal mood and affect.    MAU Course  Procedures  Results for LISSIE, HINESLEY (MRN [id number]) as of 08/14/2014 13:43  Ref. Range 08/07/2014 18:27 08/14/2014 12:51  hCG, Beta Chain, Quant, S Latest Range: <5 mIU/mL 90 (H) 64 (H)   US Ob Transvaginal  08/14/2014   CLINICAL DATA:  Evaluate for ectopic pregnancy.  Pelvic pain.  EXAM: OBSTETRIC <14 WK ULTRASOUND  TECHNIQUE: Transabdominal ultrasound was performed for evaluation of the gestation as well as the maternal uterus and adnexal regions.  COMPARISON:  Multiple priors including 08/05/2014, 07/23/2014 and 07/22/2014.  FINDINGS: Maternal uterus/adnexae: No intrauterine gestational sac is identified. The uterus is unremarkable. The right ovary measures 3.0 x 2.4 x 1.8  cm and is normal in appearance. The left ovary measures 3.8 x 2.4 x 2.7 cm and is normal in appearance. No free fluid in the pelvis. No adnexal mass identified.  IMPRESSION: No definite adnexal mass identified to suggest ectopic pregnancy.  No intrauterine gestation identified.   Electronically Signed   By: Lovey Newcomer M.D.   On: 08/14/2014 15:53    Assessment and Plan   1. Ectopic pregnancy   2. Pelvic pain affecting pregnancy in first trimester, antepartum    Ectopic precautions Return to MAU as needed  Follow-up Information    Follow up with [facility].    Specialty:  Obstetrics and Gynecology   Why:  January 7th between 8-11 am    Contact information:   [place] [phone]       [name] 08/14/2014, 1:44 PM

## 2014-08-14 NOTE — MAU Note (Signed)
Here for follow up. stiill having pain,  But it is better than it was.

## 2014-08-21 ENCOUNTER — Telehealth: Payer: Self-pay

## 2014-08-21 ENCOUNTER — Other Ambulatory Visit: Payer: Medicaid Other

## 2014-08-21 DIAGNOSIS — O00109 Unspecified tubal pregnancy without intrauterine pregnancy: Secondary | ICD-10-CM

## 2014-08-21 LAB — HCG, QUANTITATIVE, PREGNANCY: hCG, Beta Chain, Quant, S: 17 m[IU]/mL

## 2014-08-21 NOTE — Progress Notes (Unsigned)
Patient here today for repeat HCG s/p ?ectopic pregnancy/methotrexate administration. Patient reports light bleeding and occasional mild pelvic pain, though states pain has much improved since "the whole ordeal" and reports discomfort is usually at the end of the day after being active. Informed patient this is likely normal. Advised she come to MAU should pain become severe or bleeding become heavy. Reassured her of decreasing trend in HCG levels. Patient verbalized understanding and gratitude. No further questions or concerns.

## 2014-08-21 NOTE — Telephone Encounter (Signed)
Dr. Ihor Dow reviewed patient's chart and STAT HCG. Level 17. Per Dr. Ihor Dow, no need for further follow up. Attempted to contact patient. No answer. Left message stating we are calling with results, please call clinic.

## 2014-08-25 ENCOUNTER — Encounter (INDEPENDENT_AMBULATORY_CARE_PROVIDER_SITE_OTHER): Payer: Self-pay | Admitting: *Deleted

## 2014-08-25 DIAGNOSIS — Z3482 Encounter for supervision of other normal pregnancy, second trimester: Secondary | ICD-10-CM

## 2014-08-25 NOTE — Progress Notes (Signed)
FMLA paperwork completed and faxed to [employer].

## 2014-08-26 ENCOUNTER — Encounter: Payer: Self-pay | Admitting: General Practice

## 2014-08-26 NOTE — Telephone Encounter (Signed)
Attempted to contact patient. Left message on mobile number. Home number mailbox full. As this is second attempt, will send letter.

## 2014-08-28 ENCOUNTER — Inpatient Hospital Stay (HOSPITAL_COMMUNITY)
Admission: AD | Admit: 2014-08-28 | Discharge: 2014-08-28 | Disposition: A | Discharge status: Home / Self Care | Payer: 59 | Source: Ambulatory Visit | Attending: Family Medicine | Admitting: Family Medicine

## 2014-08-28 DIAGNOSIS — O001 Tubal pregnancy: Secondary | ICD-10-CM | POA: Diagnosis not present

## 2014-08-28 DIAGNOSIS — Z8759 Personal history of other complications of pregnancy, childbirth and the puerperium: Secondary | ICD-10-CM

## 2014-08-28 DIAGNOSIS — O0281 Inappropriate change in quantitative human chorionic gonadotropin (hCG) in early pregnancy: Secondary | ICD-10-CM

## 2014-08-28 DIAGNOSIS — F17210 Nicotine dependence, cigarettes, uncomplicated: Secondary | ICD-10-CM | POA: Insufficient documentation

## 2014-08-28 NOTE — MAU Note (Addendum)
PT  SAYS SHE WAS HERE ON 1-7-   HAD LABS-  TOLD  TO RETURN  TODAY . VAG BLEEDING   STOPPED  Sunday .    NO CRAMPS  ALL WEEK UNTIL   YESTERDAY-   PAIN  -3.  TODAY  PAIN    -6   BUT  NOW  IS 3 - BECAUSE SHE TOOK 1 PERCOCET  AT   1PM        SHE WENT  BACK   ON Tuesday .

## 2014-08-28 NOTE — MAU Provider Note (Signed)
History     CSN: [id number]  Arrival date and time: 08/28/14 1941   First Provider Initiated Contact with Patient 08/28/14 2004      No chief complaint on file.  HPI  Jessica Melton is a ;34 y.o. [id number] who presents today for HCG follow-up. She states that she has tried to call the clinic, but has not been able to talk with anyone. She has been followed for HCG levels after MTX therapy. Patient is without concern today. She denies any pain or bleeding.   Per note on 08/21/14: Dr. Ihor Dow reviewed patient's chart and STAT HCG. Level 17. Per Dr. Ihor Dow, no need for further follow up. Attempted to contact patient. No answer. Left message stating we are calling with results, please call clinic.   Past Medical History  Diagnosis Date  . Fibroids   . Ectopic pregnancy     Left fallopian tube    Past Surgical History  Procedure Laterality Date  . Dilation and curettage of uterus  2003  . Ectopic pregnancy surgery    . Diagnostic laparoscopy with removal of ectopic pregnancy Right 07/23/2014    Procedure: DIAGNOSTIC LAPAROSCOPY ;  Surgeon: Osborne Oman, MD;  Location: [facility];  Service: Gynecology;  Laterality: Right;    Family History  Problem Relation Age of Onset  . Cirrhosis Father   . Kidney failure Father     History  Substance Use Topics  . Smoking status: Current Every Day Smoker -- 0.25 packs/day    Types: Cigarettes  . Smokeless tobacco: Never Used  . Alcohol Use: No     Comment: socially    Allergies:  Allergies  Allergen Reactions  . Iodine Swelling    Tongue swelling    Prescriptions prior to admission  Medication Sig Dispense Refill Last Dose  . docusate sodium (COLACE) 100 MG capsule Take 1 capsule (100 mg total) by mouth 2 (two) times daily as needed. (Patient not taking: Reported on 08/14/2014) 30 capsule 2 Past Week at Unknown time  . ondansetron (ZOFRAN ODT) 4 MG disintegrating tablet Take 1 tablet (4 mg total) by mouth every 6  (six) hours as needed for nausea. (Patient not taking: Reported on 08/14/2014) 20 tablet 0   . oxyCODONE-acetaminophen (PERCOCET/ROXICET) 5-325 MG per tablet Take 1-2 tablets by mouth every 6 (six) hours as needed. (Patient taking differently: Take 1-2 tablets by mouth every 6 (six) hours as needed for moderate pain. ) 30 tablet 0 08/13/2014 at Unknown time  . promethazine (PHENERGAN) 25 MG tablet Take 1 tablet (25 mg total) by mouth every 6 (six) hours as needed for nausea or vomiting. 30 tablet 2 Past Week at Unknown time  . sennosides-docusate sodium (SENOKOT-S) 8.6-50 MG tablet Take 2 tablets by mouth daily as needed for constipation.   Past Week at Unknown time    ROS Physical Exam   Blood pressure 113/75, pulse 81, temperature 98.6 F (37 C), temperature source Oral, resp. rate 20, last menstrual period 05/16/2014, unknown if currently breastfeeding.  Physical Exam  Nursing note and vitals reviewed. Constitutional: She is oriented to person, place, and time. She appears well-developed and well-nourished. No distress.  Cardiovascular: Normal rate.   GI: Soft. There is no tenderness.  Neurological: She is alert and oriented to person, place, and time.  Skin: Skin is warm and dry.  Psychiatric: She has a normal mood and affect.    MAU Course  Procedures    Assessment and Plan   1. Hx of ectopic  pregnancy    DC home.  Reassured patient we do not need follow up at this time Return to MAU as needed   [name] 08/28/2014, 8:08 PM

## 2014-11-10 ENCOUNTER — Emergency Department (HOSPITAL_COMMUNITY): Payer: 59

## 2014-11-10 ENCOUNTER — Emergency Department (HOSPITAL_COMMUNITY)
Admission: EM | Admit: 2014-11-10 | Discharge: 2014-11-11 | Disposition: A | Discharge status: Home / Self Care | Payer: 59 | Attending: Emergency Medicine | Admitting: Emergency Medicine

## 2014-11-10 ENCOUNTER — Emergency Department (HOSPITAL_COMMUNITY)
Admission: EM | Admit: 2014-11-10 | Discharge: 2014-11-10 | Discharge status: Against Medical Advice | Payer: 59 | Source: Home / Self Care

## 2014-11-10 ENCOUNTER — Encounter (HOSPITAL_COMMUNITY): Payer: Self-pay

## 2014-11-10 ENCOUNTER — Encounter (HOSPITAL_COMMUNITY): Payer: Self-pay | Admitting: Neurology

## 2014-11-10 DIAGNOSIS — Z79899 Other long term (current) drug therapy: Secondary | ICD-10-CM | POA: Diagnosis not present

## 2014-11-10 DIAGNOSIS — Z3202 Encounter for pregnancy test, result negative: Secondary | ICD-10-CM | POA: Diagnosis not present

## 2014-11-10 DIAGNOSIS — Y998 Other external cause status: Secondary | ICD-10-CM | POA: Diagnosis not present

## 2014-11-10 DIAGNOSIS — Z8742 Personal history of other diseases of the female genital tract: Secondary | ICD-10-CM | POA: Insufficient documentation

## 2014-11-10 DIAGNOSIS — Z720 Tobacco use: Secondary | ICD-10-CM | POA: Insufficient documentation

## 2014-11-10 DIAGNOSIS — R1032 Left lower quadrant pain: Secondary | ICD-10-CM

## 2014-11-10 DIAGNOSIS — N939 Abnormal uterine and vaginal bleeding, unspecified: Secondary | ICD-10-CM | POA: Diagnosis not present

## 2014-11-10 DIAGNOSIS — S3991XA Unspecified injury of abdomen, initial encounter: Secondary | ICD-10-CM | POA: Diagnosis not present

## 2014-11-10 DIAGNOSIS — Y9389 Activity, other specified: Secondary | ICD-10-CM | POA: Insufficient documentation

## 2014-11-10 DIAGNOSIS — G8929 Other chronic pain: Secondary | ICD-10-CM | POA: Diagnosis not present

## 2014-11-10 DIAGNOSIS — Y92410 Unspecified street and highway as the place of occurrence of the external cause: Secondary | ICD-10-CM | POA: Diagnosis not present

## 2014-11-10 DIAGNOSIS — R109 Unspecified abdominal pain: Secondary | ICD-10-CM

## 2014-11-10 LAB — CBC WITH DIFFERENTIAL/PLATELET
Basophils Absolute: 0 10*3/uL (ref 0.0–0.1)
Basophils Relative: 0 % (ref 0–1)
EOS PCT: 3 % (ref 0–5)
Eosinophils Absolute: 0.3 10*3/uL (ref 0.0–0.7)
HEMATOCRIT: 35 % — AB (ref 36.0–46.0)
Hemoglobin: 11.2 g/dL — ABNORMAL LOW (ref 12.0–15.0)
LYMPHS ABS: 3.9 10*3/uL (ref 0.7–4.0)
Lymphocytes Relative: 43 % (ref 12–46)
MCH: 22.3 pg — ABNORMAL LOW (ref 26.0–34.0)
MCHC: 32 g/dL (ref 30.0–36.0)
MCV: 69.7 fL — AB (ref 78.0–100.0)
MONO ABS: 0.5 10*3/uL (ref 0.1–1.0)
MONOS PCT: 5 % (ref 3–12)
NEUTROS ABS: 4.3 10*3/uL (ref 1.7–7.7)
Neutrophils Relative %: 49 % (ref 43–77)
Platelets: 248 10*3/uL (ref 150–400)
RBC: 5.02 MIL/uL (ref 3.87–5.11)
RDW: 18.3 % — AB (ref 11.5–15.5)
WBC: 9 10*3/uL (ref 4.0–10.5)

## 2014-11-10 LAB — BASIC METABOLIC PANEL
ANION GAP: 3 — AB (ref 5–15)
BUN: 13 mg/dL (ref 6–23)
CO2: 27 mmol/L (ref 19–32)
Calcium: 8.7 mg/dL (ref 8.4–10.5)
Chloride: 103 mmol/L (ref 96–112)
Creatinine, Ser: 0.85 mg/dL (ref 0.50–1.10)
GFR calc Af Amer: >90 mL/min (ref >90)
GFR calc non Af Amer: >90 mL/min (ref >90)
GLUCOSE: 91 mg/dL (ref 70–99)
POTASSIUM: 3.7 mmol/L (ref 3.5–5.1)
Sodium: 133 mmol/L — ABNORMAL LOW (ref 135–145)

## 2014-11-10 MED ORDER — ONDANSETRON 4 MG PO TBDP
4.0000 mg | ORAL_TABLET | Freq: Once | ORAL | Status: AC
  Administered 2014-11-10: 4 mg via ORAL
  Filled 2014-11-10: qty 1

## 2014-11-10 MED ORDER — CYCLOBENZAPRINE HCL 10 MG PO TABS
5.0000 mg | ORAL_TABLET | Freq: Once | ORAL | Status: AC
  Administered 2014-11-10: 5 mg via ORAL
  Filled 2014-11-10: qty 1

## 2014-11-10 MED ORDER — IBUPROFEN 800 MG PO TABS
800.0000 mg | ORAL_TABLET | Freq: Once | ORAL | Status: AC
  Administered 2014-11-11: 800 mg via ORAL
  Filled 2014-11-10: qty 1

## 2014-11-10 MED ORDER — OXYCODONE-ACETAMINOPHEN 5-325 MG PO TABS
1.0000 | ORAL_TABLET | Freq: Once | ORAL | Status: AC
  Administered 2014-11-10: 1 via ORAL
  Filled 2014-11-10: qty 1

## 2014-11-10 NOTE — ED Notes (Signed)
Pt reports LLQ sharp pain since last week, her period started yesterday. Reports hx of same with fibroids. Reports heavy bleeding with period, which is abnormal. Pt is a x 4.
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
## 2014-11-11 ENCOUNTER — Emergency Department (HOSPITAL_COMMUNITY): Payer: 59

## 2014-11-11 DIAGNOSIS — S3991XA Unspecified injury of abdomen, initial encounter: Secondary | ICD-10-CM | POA: Diagnosis not present

## 2014-11-11 LAB — URINALYSIS, ROUTINE W REFLEX MICROSCOPIC
Bilirubin Urine: NEGATIVE
Glucose, UA: NEGATIVE mg/dL
HGB URINE DIPSTICK: NEGATIVE
KETONES UR: NEGATIVE mg/dL
NITRITE: NEGATIVE
Protein, ur: NEGATIVE mg/dL
SPECIFIC GRAVITY, URINE: 1.01 (ref 1.005–1.030)
Urobilinogen, UA: 0.2 mg/dL (ref 0.0–1.0)
pH: 6 (ref 5.0–8.0)

## 2014-11-11 LAB — WET PREP, GENITAL
Clue Cells Wet Prep HPF POC: NONE SEEN
Trich, Wet Prep: NONE SEEN
YEAST WET PREP: NONE SEEN

## 2014-11-11 LAB — URINE MICROSCOPIC-ADD ON

## 2014-11-11 LAB — POC URINE PREG, ED: Preg Test, Ur: NEGATIVE

## 2014-11-11 MED ORDER — ONDANSETRON HCL 4 MG/2ML IJ SOLN
4.0000 mg | Freq: Once | INTRAMUSCULAR | Status: DC
  Filled 2014-11-11: qty 2

## 2014-11-11 MED ORDER — CYCLOBENZAPRINE HCL 10 MG PO TABS: 10.0000 mg | ORAL_TABLET | Freq: Two times a day (BID) | ORAL | Status: DC | PRN

## 2014-11-11 MED ORDER — SODIUM CHLORIDE 0.9 % IV BOLUS (SEPSIS): 1000.0000 mL | Freq: Once | INTRAVENOUS | Status: DC

## 2014-11-11 MED ORDER — FENTANYL CITRATE 0.05 MG/ML IJ SOLN
50.0000 ug | Freq: Once | INTRAMUSCULAR | Status: DC
  Filled 2014-11-11: qty 2

## 2014-11-11 MED ORDER — ONDANSETRON HCL 4 MG PO TABS: 4.0000 mg | ORAL_TABLET | Freq: Four times a day (QID) | ORAL | Status: DC

## 2014-11-11 MED ORDER — OXYCODONE-ACETAMINOPHEN 5-325 MG PO TABS: 1.0000 | ORAL_TABLET | Freq: Four times a day (QID) | ORAL | Status: DC | PRN
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
## 2014-11-11 NOTE — ED Notes (Signed)
Patient transported to X-ray 

## 2014-11-11 NOTE — ED Notes (Addendum)
Patient reports, "I don't think I'm gonna stay for the CT scan."  Spoke with patient about her decision, but she says, "Can I just get some medicine and go home?"  Tiffany, PA, made aware of patient's verbalizations.
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
## 2014-11-11 NOTE — ED Notes (Signed)
Patient transported to CT 

## 2014-11-12 LAB — GC/CHLAMYDIA PROBE AMP (~~LOC~~) NOT AT ARMC
CHLAMYDIA, DNA PROBE: NEGATIVE
NEISSERIA GONORRHEA: NEGATIVE

## 2015-09-25 IMAGING — US US OB TRANSVAGINAL
1 series · 14 of 28 positions shown · non-contrast
Comparison: 07/23/2014

CLINICAL DATA: Followup ectopic pregnancy

EXAM:
TRANSVAGINAL OB ULTRASOUND
TECHNIQUE: Transvaginal ultrasound was performed for complete evaluation of the
gestation as well as the maternal uterus, adnexal regions, and
pelvic cul-de-sac.

[Series 1: us ob transvaginal · 14 of 28 slices shown]
[im 2/28]
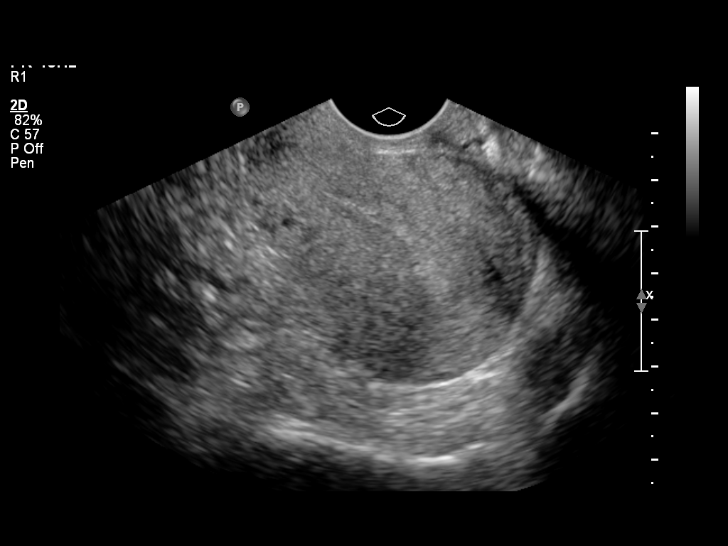
[im 4/28]
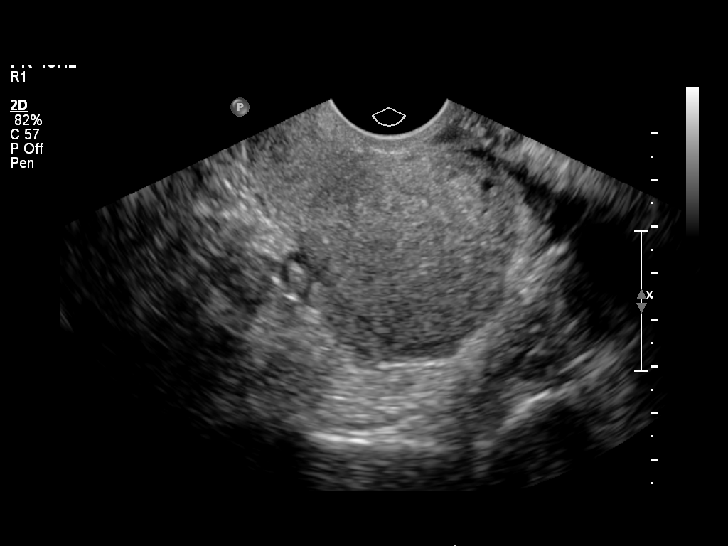
[im 6/28]
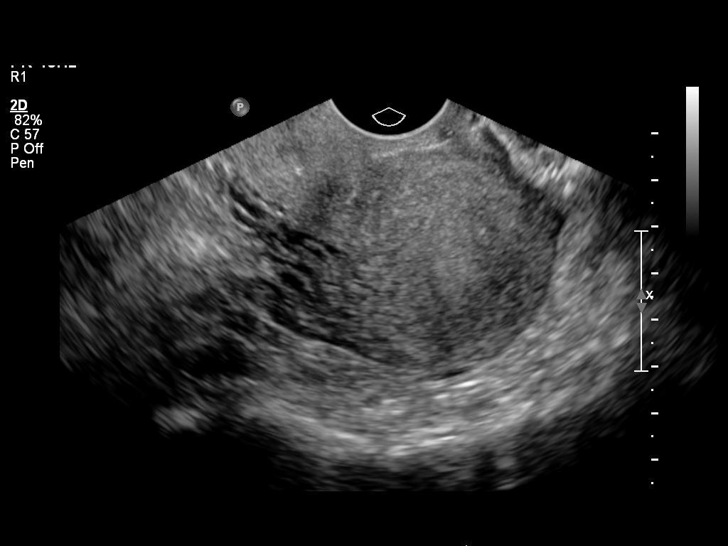
[im 8/28]
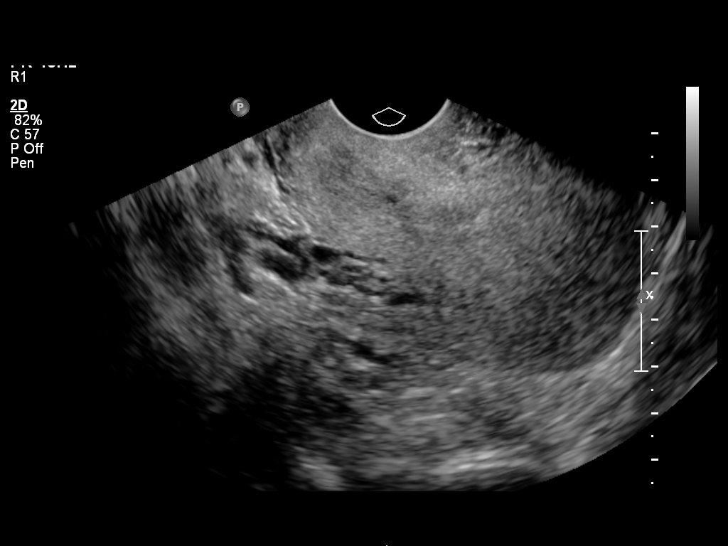
[im 10/28]
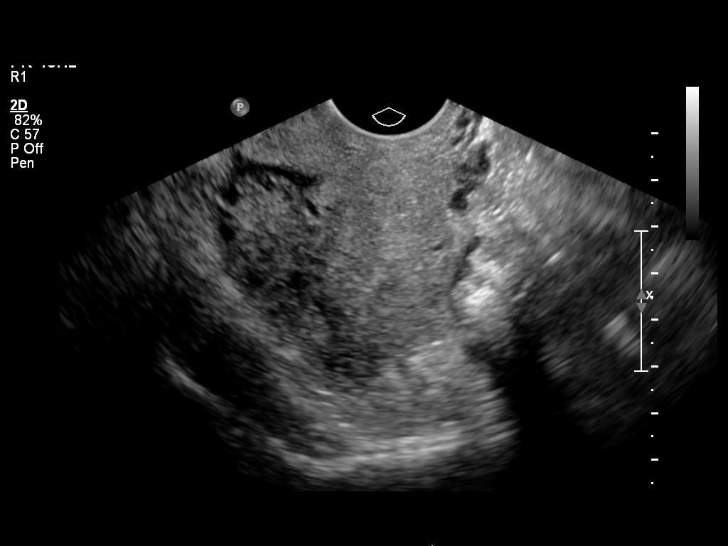
[im 12/28]
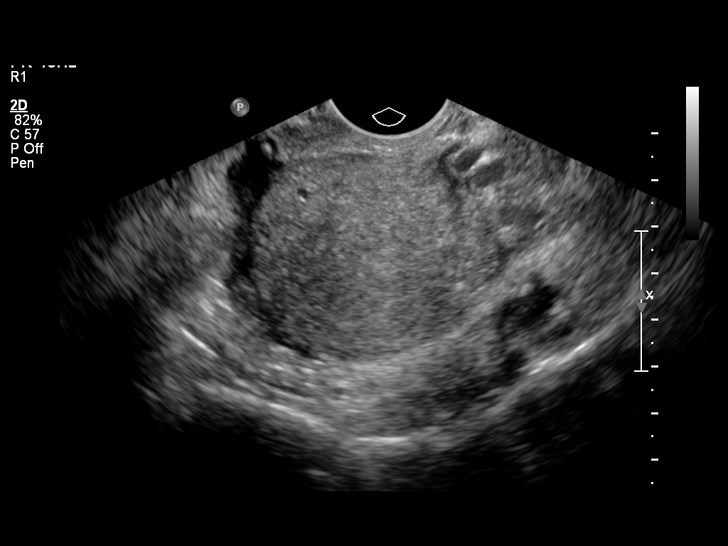
[im 14/28]
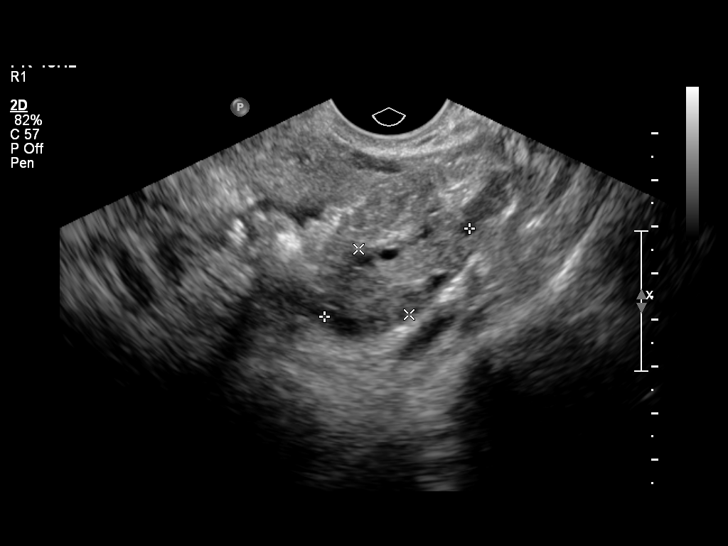
[im 16/28]
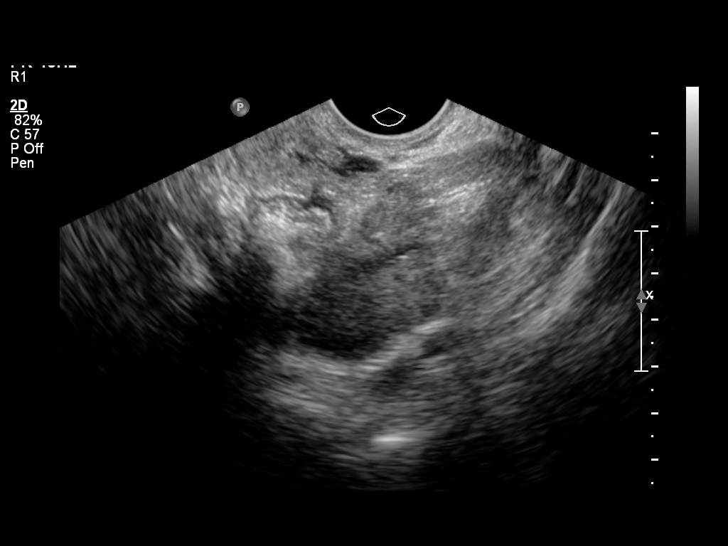
[im 18/28]
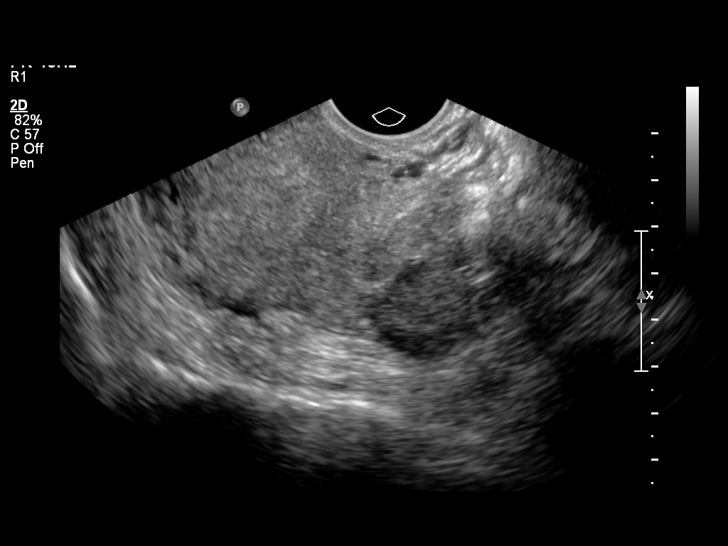
[im 20/28]
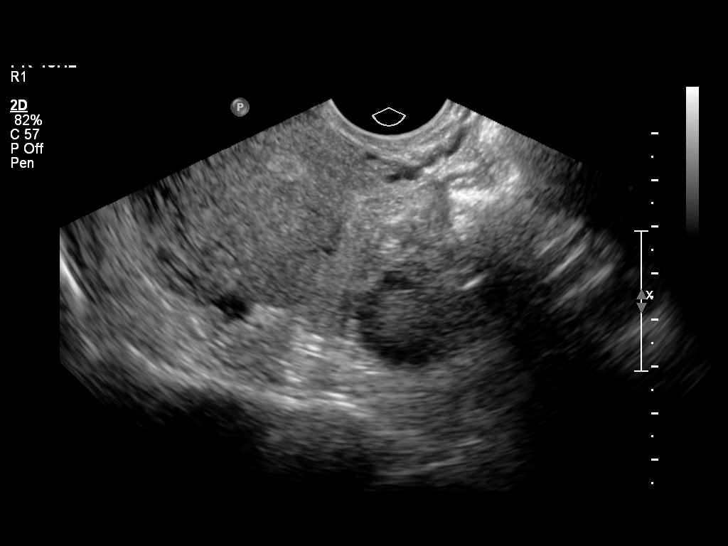
[im 22/28]
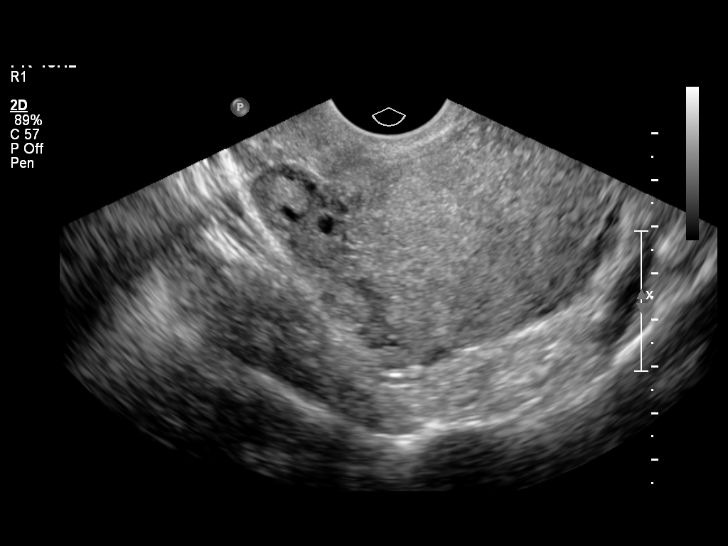
[im 24/28]
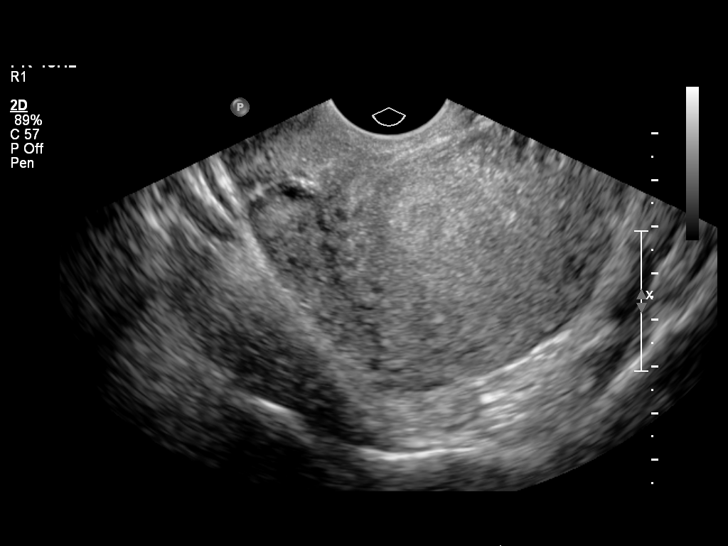
[im 26/28]
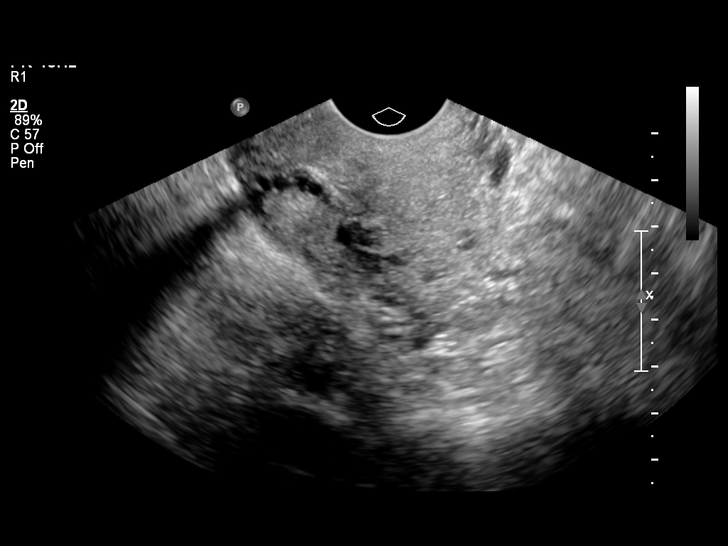
[im 28/28]
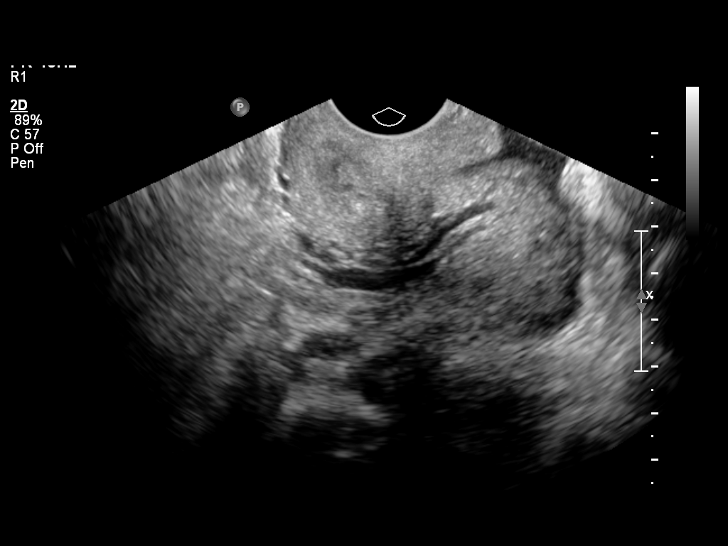

[14 of 28 positions shown; findings below may reference images not displayed]

FINDINGS: Maternal uterus/adnexae: There is no gestational sac. Uterus and
endometrial stripe are within normal limits. Ovaries are
unremarkable. No evidence of adnexal mass. Trace free fluid is
nonspecific.
IMPRESSION: No evidence of ectopic pregnancy or intrauterine gestation. Trace
free fluid is nonspecific.

## 2015-10-04 IMAGING — US US OB TRANSVAGINAL
1 series · 14 of 28 positions shown · non-contrast
Comparison: Multiple priors including 08/05/2014, 07/23/2014 and
07/22/2014.

CLINICAL DATA: Evaluate for ectopic pregnancy.  Pelvic pain.

EXAM:
OBSTETRIC <14 WK ULTRASOUND
TECHNIQUE: Transabdominal ultrasound was performed for evaluation of the
gestation as well as the maternal uterus and adnexal regions.

[Series 1: us ob transvaginal · 14 of 32 slices shown]
[im 2/32]
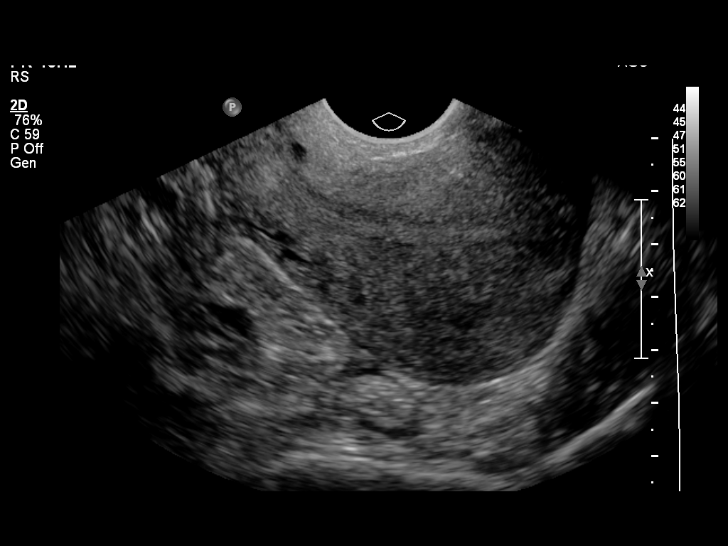
[im 4/32]
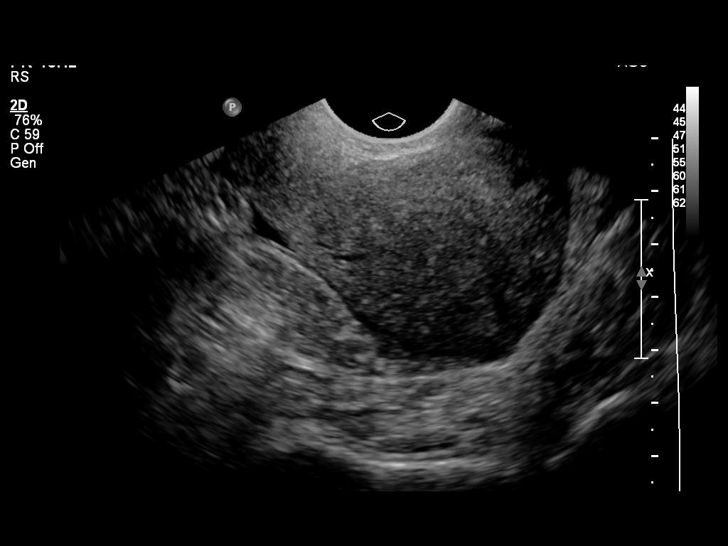
[im 6/32]
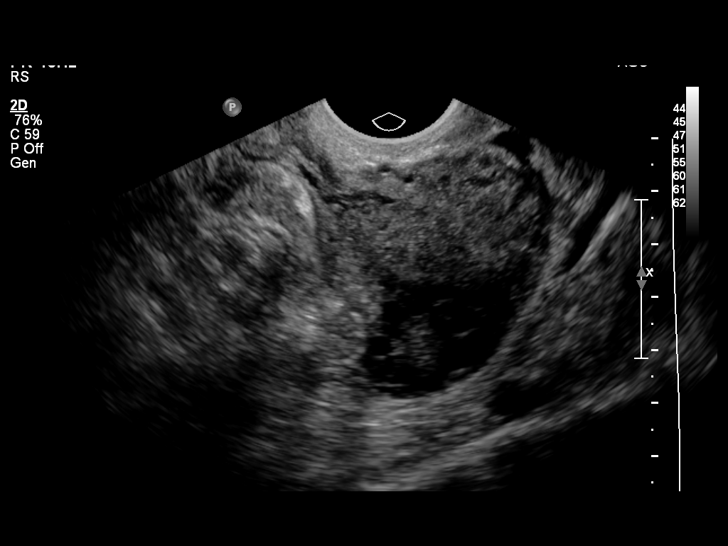
[im 9/32]
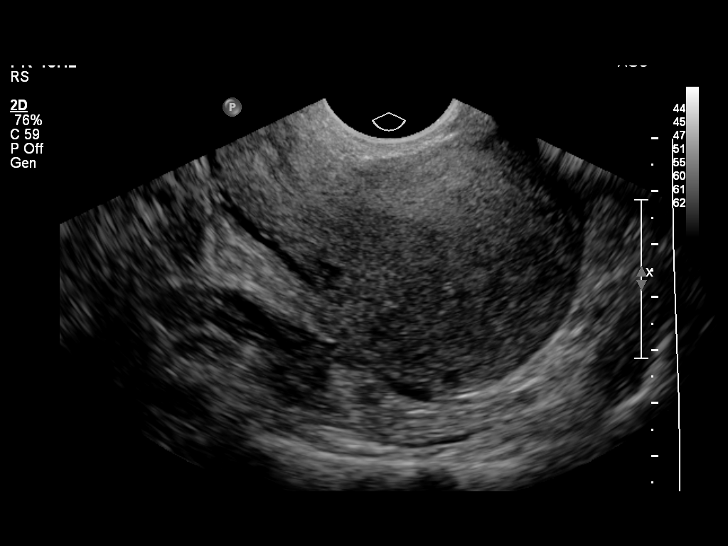
[im 11/32]
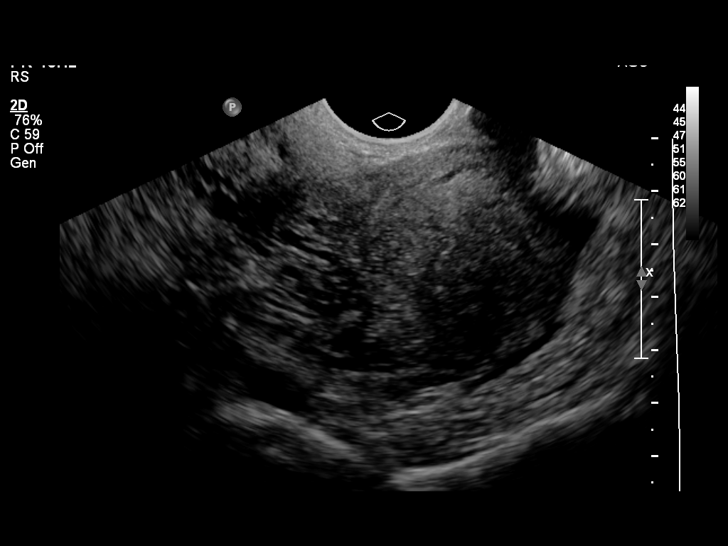
[im 13/32]
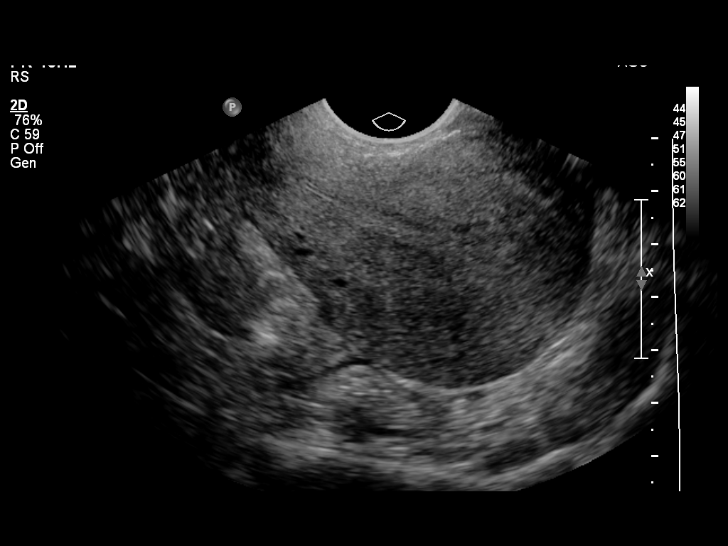
[im 15/32]
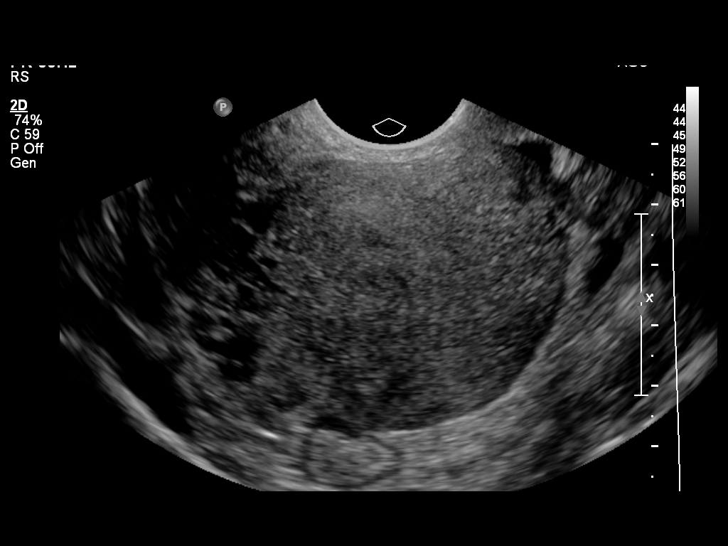
[im 18/32]
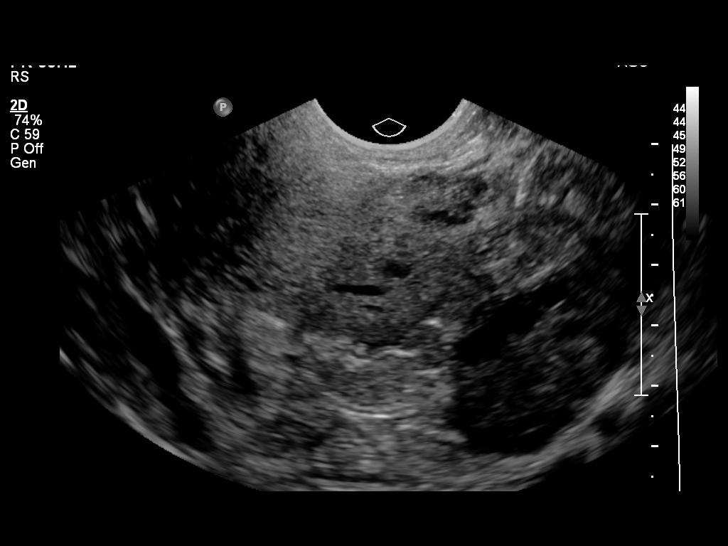
[im 20/32]
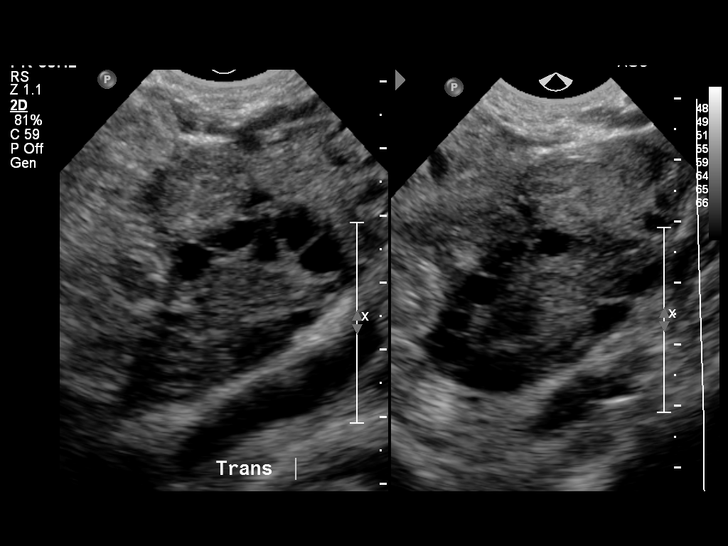
[im 22/32]
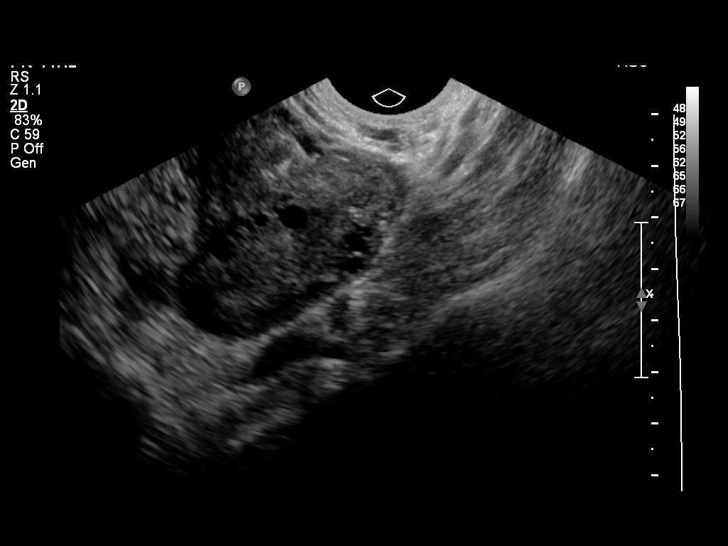
[im 25/32]
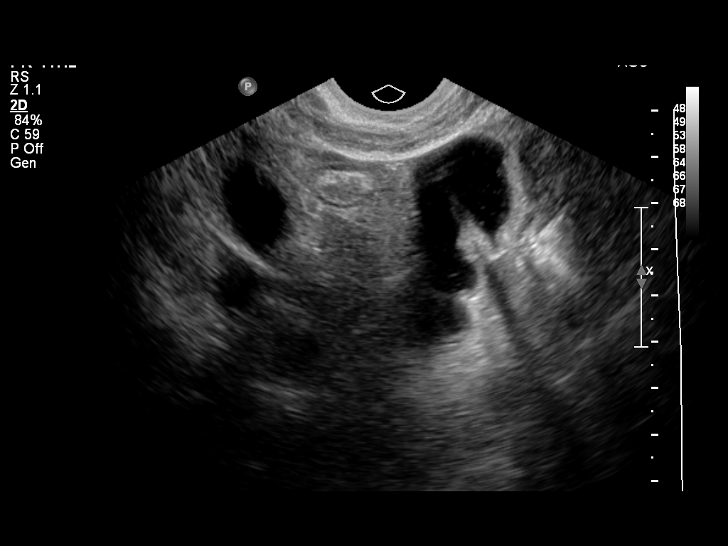
[im 27/32]
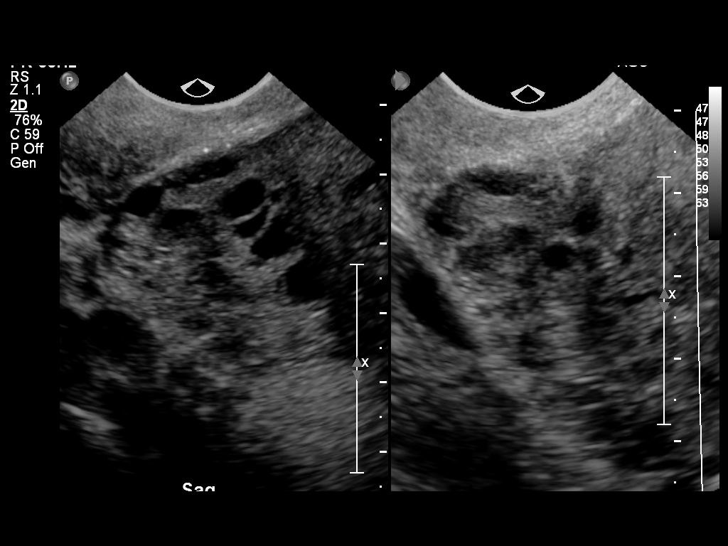
[im 29/32]
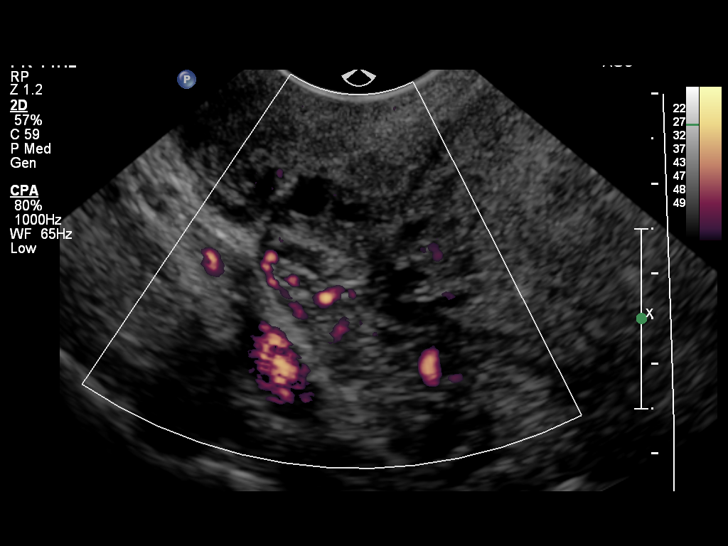
[im 32/32]
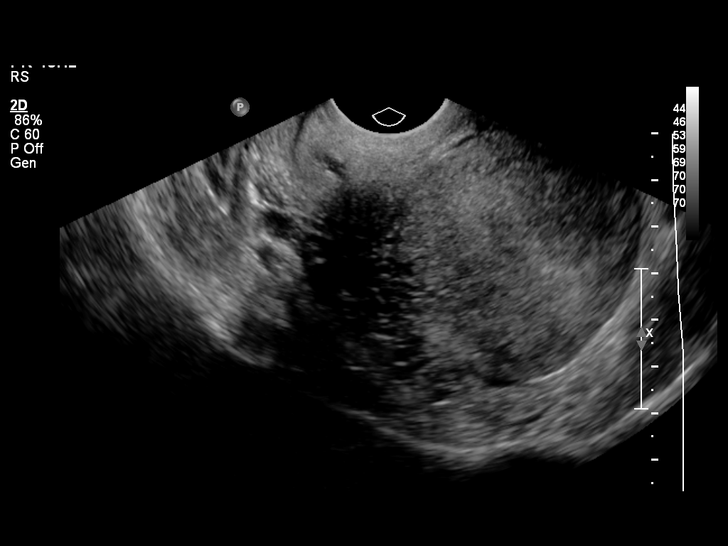

[14 of 28 positions shown; findings below may reference images not displayed]

FINDINGS: Maternal uterus/adnexae: No intrauterine gestational sac is
identified. The uterus is unremarkable. The right ovary measures
x 2.4 x 1.8 cm and is normal in appearance. The left ovary measures
3.8 x 2.4 x 2.7 cm and is normal in appearance. No free fluid in the
pelvis. No adnexal mass identified.
IMPRESSION: No definite adnexal mass identified to suggest ectopic pregnancy.

No intrauterine gestation identified.
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
## 2016-09-19 ENCOUNTER — Ambulatory Visit (HOSPITAL_BASED_OUTPATIENT_CLINIC_OR_DEPARTMENT_OTHER): Admission: RE | Admit: 2016-09-19 | Payer: 59 | Source: Ambulatory Visit

## 2016-09-19 ENCOUNTER — Ambulatory Visit (INDEPENDENT_AMBULATORY_CARE_PROVIDER_SITE_OTHER): Payer: 59 | Admitting: Urgent Care

## 2016-09-19 VITALS — BP 112/84 | HR 80 | Temp 98.7°F | Resp 18 | Ht 69.0 in | Wt 239.0 lb

## 2016-09-19 DIAGNOSIS — R109 Unspecified abdominal pain: Secondary | ICD-10-CM

## 2016-09-19 DIAGNOSIS — R102 Pelvic and perineal pain: Secondary | ICD-10-CM | POA: Diagnosis not present

## 2016-09-19 DIAGNOSIS — N946 Dysmenorrhea, unspecified: Secondary | ICD-10-CM | POA: Diagnosis not present

## 2016-09-19 DIAGNOSIS — N926 Irregular menstruation, unspecified: Secondary | ICD-10-CM

## 2016-09-19 LAB — POCT CBC
Granulocyte percent: 58.3 %G (ref 37–80)
HCT, POC: 37.1 % — AB (ref 37.7–47.9)
Hemoglobin: 12.3 g/dL (ref 12.2–16.2)
LYMPH, POC: 3.2 (ref 0.6–3.4)
MCH: 24.5 pg — AB (ref 27–31.2)
MCHC: 33.2 g/dL (ref 31.8–35.4)
MCV: 73.6 fL — AB (ref 80–97)
MID (CBC): 0.1 (ref 0–0.9)
MPV: 8.5 fL (ref 0–99.8)
POC Granulocyte: 4.7 (ref 2–6.9)
POC LYMPH PERCENT: 39.9 %L (ref 10–50)
POC MID %: 1.8 % (ref 0–12)
Platelet Count, POC: 307 10*3/uL (ref 142–424)
RBC: 5.04 M/uL (ref 4.04–5.48)
RDW, POC: 17 %
WBC: 8.1 10*3/uL (ref 4.6–10.2)

## 2016-09-19 LAB — POCT URINALYSIS DIP (MANUAL ENTRY)
Bilirubin, UA: NEGATIVE
GLUCOSE UA: NEGATIVE
Ketones, POC UA: NEGATIVE
Leukocytes, UA: NEGATIVE
NITRITE UA: NEGATIVE
PH UA: 8.5
Protein Ur, POC: 30 — AB
Spec Grav, UA: 1.02
Urobilinogen, UA: 1

## 2016-09-19 LAB — POCT URINE PREGNANCY: Preg Test, Ur: NEGATIVE

## 2016-09-19 MED ORDER — NAPROXEN SODIUM 550 MG PO TABS: 550.0000 mg | ORAL_TABLET | Freq: Two times a day (BID) | ORAL | 1 refills | Status: DC

## 2016-09-19 MED ORDER — KETOROLAC TROMETHAMINE 60 MG/2ML IM SOLN
60.0000 mg | Freq: Once | INTRAMUSCULAR | Status: AC
  Administered 2016-09-19: 60 mg via INTRAMUSCULAR

## 2016-09-19 NOTE — Progress Notes (Signed)
MRN: [id number] DOB: August 29, 1980  Subjective:   Jessica Melton is a 36 y.o. female presenting for chief complaint of Abdominal Pain (x 4 days )  Reports 4 day history of lower abdominal pain. She has belly cramps traditionally for PMS but admits that her cramping has significantly worsened over the past few months. Her cycles have been heavier and irregular, LMP was 08/15/2016. Reports that she had 2 cycles in December and is currently on one. Has had associated nausea without vomiting. She has scheduled an OV with her gynecologist 09/28/2016 in [place], [place]. Denies fever, vaginal discharge, genital rashes, dysuria, hematuria. Smokes 1/2ppd. Drinks alcohol socially on the weekends. Patient is sexually active, wears condoms for protection. Admits history of ovarian cysts and uterine fibroids.   Jessica Melton has a current medication list which includes the following prescription(s): cyclobenzaprine, docusate sodium, ibuprofen, ondansetron, ondansetron, oxycodone-acetaminophen, oxycodone-acetaminophen, and promethazine. Also is allergic to iodine.  Jessica Melton  has a past medical history of Ectopic pregnancy and Fibroids. Also  has a past surgical history that includes Dilation and curettage of uterus (2003); Ectopic pregnancy surgery; and Diagnostic laparoscopy with removal of ectopic pregnancy (Right, 07/23/2014).  Objective:   Vitals: BP 112/84   Pulse 80   Temp 98.7 F (37.1 C) (Oral)   Resp 18   Ht 5\' 9"  (1.753 m)   Wt 239 lb (108.4 kg)   LMP 09/19/2016   SpO2 99%   BMI 35.29 kg/m   Physical Exam  Constitutional: She is oriented to person, place, and time. She appears well-developed and well-nourished.  Cardiovascular: Normal rate, regular rhythm and intact distal pulses.  Exam reveals no gallop and no friction rub.   No murmur heard. Pulmonary/Chest: No respiratory distress. She has no wheezes. She has no rales.  Abdominal: Soft. Bowel sounds are normal. She exhibits no distension and no mass.  There is tenderness (generalized abdominal pain < pelvic). There is no guarding.  Genitourinary: No labial fusion. There is no rash, tenderness, lesion or injury on the right labia. There is no rash, tenderness, lesion or injury on the left labia. Uterus is not deviated, not enlarged, not fixed and not tender. Cervix exhibits motion tenderness. Cervix exhibits no discharge and no friability. Right adnexum displays tenderness. Right adnexum displays no mass and no fullness. Left adnexum displays no mass, no tenderness and no fullness. There is tenderness (throughout entire pelvic exam) and bleeding in the vagina. No erythema in the vagina. No foreign body in the vagina. No signs of injury around the vagina. No vaginal discharge found.  Lymphadenopathy:       Right: No inguinal adenopathy present.       Left: No inguinal adenopathy present.  Neurological: She is alert and oriented to person, place, and time.  Skin: Skin is warm and dry.   Results for orders placed or performed in visit on 09/19/16 (from the past 24 hour(s))  POCT urinalysis dipstick     Status: Abnormal   Collection Time: 09/19/16  4:40 PM  Result Value Ref Range   Color, UA brown (A) yellow   Clarity, UA cloudy (A) clear   Glucose, UA negative negative   Bilirubin, UA negative negative   Ketones, POC UA negative negative   Spec Grav, UA 1.020    Blood, UA large (A) negative   pH, UA 8.5    Protein Ur, POC =30 (A) negative   Urobilinogen, UA 1.0    Nitrite, UA Negative Negative   Leukocytes, UA Negative Negative  POCT  CBC     Status: Abnormal   Collection Time: 09/19/16  5:02 PM  Result Value Ref Range   WBC 8.1 4.6 - 10.2 K/uL   Lymph, poc 3.2 0.6 - 3.4   POC LYMPH PERCENT 39.9 10 - 50 %L   MID (cbc) 0.1 0 - 0.9   POC MID % 1.8 0 - 12 %M   POC Granulocyte 4.7 2 - 6.9   Granulocyte percent 58.3 37 - 80 %G   RBC 5.04 4.04 - 5.48 M/uL   Hemoglobin 12.3 12.2 - 16.2 g/dL   HCT, POC 37.1 (A) 37.7 - 47.9 %   MCV 73.6 (A)  80 - 97 fL   MCH, POC 24.5 (A) 27 - 31.2 pg   MCHC 33.2 31.8 - 35.4 g/dL   RDW, POC 17.0 %   Platelet Count, POC 307 142 - 424 K/uL   MPV 8.5 0 - 99.8 fL  POCT urine pregnancy     Status: None   Collection Time: 09/19/16  5:10 PM  Result Value Ref Range   Preg Test, Ur Negative Negative   Assessment and Plan :   1. Abdominal pain, unspecified abdominal location 2. Pelvic pain in female 3. Irregular periods/menstrual cycles 4. Dysmenorrhea - Will pursue stat pelvic and transvaginal US. Use Anaprox in the meantime. Toradol injection given today in clinic. Labs pending. Keep appointment with gynecologist.  Jaynee Eagles, PA-C [facility] [phone] 09/19/2016  4:33 PM
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
## 2016-09-20 ENCOUNTER — Ambulatory Visit: Payer: 59 | Admitting: Obstetrics & Gynecology

## 2016-09-20 LAB — COMPREHENSIVE METABOLIC PANEL
A/G RATIO: 1.6 (ref 1.2–2.2)
ALBUMIN: 4 g/dL (ref 3.5–5.5)
ALK PHOS: 83 IU/L (ref 39–117)
ALT: 8 IU/L (ref 0–32)
AST: 17 IU/L (ref 0–40)
BILIRUBIN TOTAL: 0.2 mg/dL (ref 0.0–1.2)
BUN/Creatinine Ratio: 10 (ref 9–23)
BUN: 7 mg/dL (ref 6–20)
CO2: 20 mmol/L (ref 18–29)
Calcium: 8.7 mg/dL (ref 8.7–10.2)
Chloride: 104 mmol/L (ref 96–106)
Creatinine, Ser: 0.68 mg/dL (ref 0.57–1.00)
GFR calc non Af Amer: 115 mL/min/{1.73_m2} (ref >59)
GFR, EST AFRICAN AMERICAN: 133 mL/min/{1.73_m2} (ref >59)
GLOBULIN, TOTAL: 2.5 g/dL (ref 1.5–4.5)
GLUCOSE: 82 mg/dL (ref 65–99)
POTASSIUM: 4.8 mmol/L (ref 3.5–5.2)
SODIUM: 139 mmol/L (ref 134–144)
TOTAL PROTEIN: 6.5 g/dL (ref 6.0–8.5)

## 2016-09-20 LAB — HIV ANTIBODY (ROUTINE TESTING W REFLEX): HIV Screen 4th Generation wRfx: NONREACTIVE

## 2016-09-20 LAB — URINALYSIS, MICROSCOPIC ONLY: CASTS: NONE SEEN /LPF

## 2016-09-20 LAB — RPR: RPR Ser Ql: NONREACTIVE

## 2016-09-21 LAB — GC/CHLAMYDIA PROBE AMP
CHLAMYDIA, DNA PROBE: NEGATIVE
NEISSERIA GONORRHOEAE BY PCR: NEGATIVE

## 2016-09-28 ENCOUNTER — Ambulatory Visit: Payer: 59 | Admitting: Obstetrics & Gynecology

## 2016-10-06 ENCOUNTER — Other Ambulatory Visit (HOSPITAL_COMMUNITY)
Admission: RE | Admit: 2016-10-06 | Discharge: 2016-10-06 | Disposition: A | Discharge status: Home / Self Care | Payer: 59 | Source: Ambulatory Visit | Attending: Obstetrics and Gynecology | Admitting: Obstetrics and Gynecology

## 2016-10-06 ENCOUNTER — Encounter: Payer: Self-pay | Admitting: Obstetrics and Gynecology

## 2016-10-06 ENCOUNTER — Ambulatory Visit (INDEPENDENT_AMBULATORY_CARE_PROVIDER_SITE_OTHER): Payer: 59 | Admitting: Obstetrics and Gynecology

## 2016-10-06 VITALS — BP 103/73 | HR 86 | Resp 18 | Ht 69.0 in | Wt 238.0 lb

## 2016-10-06 DIAGNOSIS — Z6835 Body mass index (BMI) 35.0-35.9, adult: Secondary | ICD-10-CM

## 2016-10-06 DIAGNOSIS — Z01411 Encounter for gynecological examination (general) (routine) with abnormal findings: Secondary | ICD-10-CM | POA: Insufficient documentation

## 2016-10-06 DIAGNOSIS — Z01419 Encounter for gynecological examination (general) (routine) without abnormal findings: Secondary | ICD-10-CM | POA: Diagnosis not present

## 2016-10-06 DIAGNOSIS — Z0000 Encounter for general adult medical examination without abnormal findings: Secondary | ICD-10-CM

## 2016-10-06 DIAGNOSIS — E669 Obesity, unspecified: Secondary | ICD-10-CM

## 2016-10-06 DIAGNOSIS — N946 Dysmenorrhea, unspecified: Secondary | ICD-10-CM

## 2016-10-06 DIAGNOSIS — Z8759 Personal history of other complications of pregnancy, childbirth and the puerperium: Secondary | ICD-10-CM | POA: Insufficient documentation

## 2016-10-06 DIAGNOSIS — Z720 Tobacco use: Secondary | ICD-10-CM

## 2016-10-06 DIAGNOSIS — Z1151 Encounter for screening for human papillomavirus (HPV): Secondary | ICD-10-CM | POA: Insufficient documentation

## 2016-10-06 MED ORDER — MEFENAMIC ACID 250 MG PO CAPS: ORAL_CAPSULE | ORAL | 1 refills | Status: DC

## 2016-10-06 NOTE — Progress Notes (Signed)
Obstetrics and Gynecology Visit Annual Patient Evaluation  Appointment Date: 10/06/2016  OBGYN Clinic: [facility]  Primary Care Provider: No PCP Per Patient  Chief Complaint:  Chief Complaint  Patient presents with  . Gynecologic Exam    History of Present Illness: Jessica Melton is a 36 y.o. African-American [phone] (Patient's last menstrual period was 09/19/2016.), seen for the above chief complaint. Her past medical history is significant for BMI 35, tobacco abuse, history of ectopic pregnancy.    No breast s/s, fevers, chills, chest pain, SOB, nausea, vomiting, abdominal pain, dysuria, hematuria, vaginal itching, dyspareunia, diarrhea, constipation, blood in BMs  Review of Systems: as noted in the History of Present Illness.  Past Medical History:  Past Medical History:  Diagnosis Date  . Ectopic pregnancy    Left fallopian tube  . Fibroids     Past Surgical History:  Past Surgical History:  Procedure Laterality Date  . DIAGNOSTIC LAPAROSCOPY WITH REMOVAL OF ECTOPIC PREGNANCY Right 07/23/2014   diagnostic laparoscopy and removal of hemoperitoneum only  . DILATION AND CURETTAGE OF UTERUS  2003    Past Obstetrical History:  OB History  Gravida Para Term Preterm AB Living  5 1   1 2 1   SAB TAB Ectopic Multiple Live Births  1   1   1     # Outcome Date GA Lbr Len/2nd Weight Sex Delivery Anes PTL Lv  5 Gravida           4 Preterm 08/2002 [redacted]w[redacted]d    Vag-Spont   LIV  3 Ectopic 08/2001          2 SAB           1 Gravida              Past Gynecological History: As per HPI. Periods: qmonth Last pap:? 2-3 years ago. H/o abnormal paps No. history of STIs.   She is currently using nothing for contraception.   Social History:  Social History   Social History  . Marital status: Single    Spouse name: N/A  . Number of children: N/A  . Years of education: N/A   Occupational History  . Not on file.   Social History Main Topics  . Smoking  status: Current Every Day Smoker    Packs/day: 0.50    Types: Cigarettes  . Smokeless tobacco: Never Used  . Alcohol use Yes     Comment: socially  . Drug use: Yes    Types: Marijuana  . Sexual activity: Yes    Birth control/ protection: Condom   Other Topics Concern  . Not on file   Social History Narrative  . No narrative on file    Family History:  Family History  Problem Relation Age of Onset  . Cirrhosis Father   . Kidney failure Father    She denies any female cancers, bleeding or blood clotting disorders.   Medications None  Allergies Iodine   Physical Exam:  BP 103/73 (BP Location: Left Arm, Patient Position: Sitting, Cuff Size: Large)   Pulse 86   Resp 18   Ht 5\' 9"  (1.753 m)   Wt 238 lb (108 kg)   LMP 09/19/2016   BMI 35.15 kg/m  Body mass index is 35.15 kg/m. General appearance: Well nourished, well developed female in no acute distress.  Neck:  Supple, normal appearance, and no thyromegaly  Cardiovascular: normal s1 and s2.  No murmurs, rubs or gallops. Respiratory:  Clear to auscultation bilateral. Normal  respiratory effort Abdomen: positive bowel sounds and no masses, hernias; diffusely non tender to palpation, non distended Breasts: breasts appear normal, no suspicious masses, no skin or nipple changes or axillary nodes, and normal palpation. Neuro/Psych:  Normal mood and affect.  Skin:  Warm and dry.  Lymphatic:  No inguinal lymphadenopathy.   Pelvic exam: is not limited by body habitus EGBUS: within normal limits, Vagina: within normal limits and with no blood or discharge in the vault, Cervix: normal appearing cervix without tenderness, discharge or lesions. Uterus:  nonenlarged and non tender and Adnexa:  normal adnexa and no mass, fullness, tenderness Rectovaginal: deferred  Laboratory: none  Radiology: 2015 u/s reviewed and no mention of fibroids.   Assessment: Dysmenorrhea. Patient doing well  Plan:  1. Encounter for gynecological  examination without abnormal finding Normal well woman exam Patient doesn't have a pcp. She has been fasting and pt amenable to TSH and lipid screening Pap updated today Patient with recent STI screening - Cytology - PAP - Lipid Panel With LDL/HDL Ratio - TSH  2. Dysmenorrhea D/w her re: options. I told her that her u/s from 2015 doesn't indicate fibroids.  I d/w her re: BC options as they can do help with your period and offer contraception and she'll consider LNG IUD; she used depo in the past and it caused prolonged and irregular bleeding.  In the interim, I told her about using NSAIDs pre and during period and she is amenable to this and she'd like to do an Rx strength. Mefenamic acid tabs sent and pt to give this 2-3 cycles to see how she likes it.   3. Tobacco abuse Counseling given. Pt to consider quit date of her DOB  4. History of ectopic pregnancy No current issues  5. Class 2 obesity with body mass index (BMI) of 35.0 to 35.9 in adult, unspecified obesity type, unspecified whether serious comorbidity present See above.  - Lipid Panel With LDL/HDL Ratio - TSH  RTC 1 year  Durene Romans MD Attending Center for [facility])

## 2016-10-07 LAB — LIPID PANEL WITH LDL/HDL RATIO
Cholesterol, Total: 167 mg/dL (ref 100–199)
HDL: 51 mg/dL (ref >39)
LDL CALC: 96 (ref 0–99)
LDl/HDL Ratio: 1.9 (ref 0.0–3.2)
Triglycerides: 99 mg/dL (ref 0–149)
VLDL CHOLESTEROL CAL: 20 (ref 5–40)

## 2016-10-07 LAB — TSH: TSH: 0.865 u[IU]/mL (ref 0.450–4.500)

## 2016-10-10 LAB — CYTOLOGY - PAP: HPV (WINDOPATH): NOT DETECTED

## 2016-10-11 ENCOUNTER — Telehealth: Payer: Self-pay | Admitting: Obstetrics and Gynecology

## 2016-10-11 DIAGNOSIS — R87619 Unspecified abnormal cytological findings in specimens from cervix uteri: Secondary | ICD-10-CM | POA: Insufficient documentation

## 2016-10-11 NOTE — Telephone Encounter (Signed)
GYN Telephone Note Patient called at [phone] and d/w her re: AGUS pap and need for colpo. Pt amenable to this. Inbasket message sent to office to set this up sometime in the next month.  Durene Romans MD Attending Center for [facility] (Faculty Practice) 10/11/2016 Time: 1143am

## 2016-10-12 ENCOUNTER — Telehealth: Payer: Self-pay | Admitting: *Deleted

## 2016-10-12 ENCOUNTER — Encounter: Payer: Self-pay | Admitting: *Deleted

## 2016-10-12 NOTE — Telephone Encounter (Signed)
Scheduled pt for Colpo/Endo bx on 11-04-16 at 1045.  Pt requesting something to help with anxiety prior to the procedure, will send message to Dr Ilda Basset.

## 2016-10-12 NOTE — Telephone Encounter (Signed)
-----   Message from Aletha Halim, MD sent at 10/11/2016 11:44 AM EST ----- Can you call her and set her up for a colpo and endometrial biopsy with me for sometime in the next month? thanks

## 2016-11-04 ENCOUNTER — Ambulatory Visit (INDEPENDENT_AMBULATORY_CARE_PROVIDER_SITE_OTHER): Payer: 59 | Admitting: Obstetrics and Gynecology

## 2016-11-04 VITALS — BP 106/76 | HR 84 | Ht 69.0 in | Wt 236.0 lb

## 2016-11-04 DIAGNOSIS — Z01812 Encounter for preprocedural laboratory examination: Secondary | ICD-10-CM | POA: Diagnosis not present

## 2016-11-04 DIAGNOSIS — R87619 Unspecified abnormal cytological findings in specimens from cervix uteri: Secondary | ICD-10-CM

## 2016-11-04 DIAGNOSIS — Z3202 Encounter for pregnancy test, result negative: Secondary | ICD-10-CM | POA: Diagnosis not present

## 2016-11-04 LAB — POCT URINE PREGNANCY: Preg Test, Ur: NEGATIVE

## 2016-11-04 NOTE — Procedures (Addendum)
Colposcopy, Cervical Biopsy, Endocervical Curettage, and Endometrial Biopsy Procedure Note  Pre-operative Diagnosis: AGUS, HPV negative pap smear. BMI 35  Post-operative Diagnosis: Same. CIN 2  Procedure Details  LMP late February; UPT negative.   The risks (including infection, bleeding, pain) and benefits of the procedure were explained to the patient and written informed consent was obtained.  The patient was placed in the dorsal lithotomy position. A Graves speculum was inserted in the vagina, and the cervix was visualized.  AA staining.  Biopsy from 6 o'clock done and then single toothed tenaculum applied and ECC in all four quadrants done. Next, an endometrial biopsy was done (sounded to 8.5cm, moderate tissue with two passes). No bleeding after procedure after application of Monsel's to biopsy site.  Findings: mosaicism and punctuate area seen at 6 o'clock coming from the os, tracking to the posterior aspect of the cervix.   Adequate: No  Specimens: cervical biopsy (6 o'clock), ECC, and endometrial biopsy  Condition: Stable  Complications: None  Plan: The patient was advised to call for any fever or for prolonged or severe pain or bleeding. She was advised to use OTC analgesics as needed for mild to moderate pain. She was advised to avoid vaginal intercourse for 48 hours or until the bleeding has completely stopped.  Given inadequate colpo, likely will need excisional procedure.   Durene Romans MD Attending Center for [employer])

## 2016-11-16 ENCOUNTER — Telehealth: Payer: Self-pay | Admitting: *Deleted

## 2016-11-16 NOTE — Telephone Encounter (Signed)
Informed pt of results and recommendation to follow-up with yearly pap smears for the next few years.  Pt acknowledged instructions.

## 2016-11-16 NOTE — Telephone Encounter (Signed)
-----   Message from Aletha Halim, MD sent at 11/16/2016 12:57 PM EDT ----- Can you let her know that her colpo results came back just slightly abnormal for the cervix but normal for the uterus and recommend q year pap smears for the next few years? thanks

## 2017-11-25 ENCOUNTER — Emergency Department (HOSPITAL_COMMUNITY)
Admission: EM | Admit: 2017-11-25 | Discharge: 2017-11-26 | Disposition: A | Discharge status: Other Acute Inpatient Hospital | Payer: 59 | Attending: Emergency Medicine | Admitting: Emergency Medicine

## 2017-11-25 ENCOUNTER — Encounter (HOSPITAL_COMMUNITY): Payer: Self-pay | Admitting: Emergency Medicine

## 2017-11-25 DIAGNOSIS — Z915 Personal history of self-harm: Secondary | ICD-10-CM | POA: Insufficient documentation

## 2017-11-25 DIAGNOSIS — F331 Major depressive disorder, recurrent, moderate: Secondary | ICD-10-CM | POA: Diagnosis not present

## 2017-11-25 DIAGNOSIS — F1220 Cannabis dependence, uncomplicated: Secondary | ICD-10-CM

## 2017-11-25 DIAGNOSIS — F17210 Nicotine dependence, cigarettes, uncomplicated: Secondary | ICD-10-CM | POA: Insufficient documentation

## 2017-11-25 DIAGNOSIS — Z563 Stressful work schedule: Secondary | ICD-10-CM | POA: Diagnosis not present

## 2017-11-25 DIAGNOSIS — Z046 Encounter for general psychiatric examination, requested by authority: Secondary | ICD-10-CM | POA: Diagnosis present

## 2017-11-25 DIAGNOSIS — F332 Major depressive disorder, recurrent severe without psychotic features: Secondary | ICD-10-CM

## 2017-11-25 LAB — CBC WITH DIFFERENTIAL/PLATELET
Basophils Absolute: 0 10*3/uL (ref 0.0–0.1)
Basophils Relative: 0 %
EOS ABS: 0.2 10*3/uL (ref 0.0–0.7)
Eosinophils Relative: 2 %
HCT: 37.6 % (ref 36.0–46.0)
Hemoglobin: 12.2 g/dL (ref 12.0–15.0)
LYMPHS ABS: 3.5 10*3/uL (ref 0.7–4.0)
Lymphocytes Relative: 40 %
MCH: 24.8 pg — AB (ref 26.0–34.0)
MCHC: 32.4 g/dL (ref 30.0–36.0)
MCV: 76.4 fL — ABNORMAL LOW (ref 78.0–100.0)
MONO ABS: 0.5 10*3/uL (ref 0.1–1.0)
Monocytes Relative: 5 %
Neutro Abs: 4.6 10*3/uL (ref 1.7–7.7)
Neutrophils Relative %: 53 %
PLATELETS: 241 10*3/uL (ref 150–400)
RBC: 4.92 MIL/uL (ref 3.87–5.11)
RDW: 17.2 % — AB (ref 11.5–15.5)
WBC: 8.7 10*3/uL (ref 4.0–10.5)

## 2017-11-25 LAB — SALICYLATE LEVEL

## 2017-11-25 LAB — ACETAMINOPHEN LEVEL: Acetaminophen (Tylenol), Serum: <10 ug/mL — ABNORMAL LOW (ref 10–30)

## 2017-11-25 LAB — COMPREHENSIVE METABOLIC PANEL
ALBUMIN: 3.7 g/dL (ref 3.5–5.0)
ALT: 21 U/L (ref 14–54)
AST: 28 U/L (ref 15–41)
Alkaline Phosphatase: 80 U/L (ref 38–126)
Anion gap: 9 (ref 5–15)
BUN: 9 mg/dL (ref 6–20)
CHLORIDE: 105 mmol/L (ref 101–111)
CO2: 25 mmol/L (ref 22–32)
CREATININE: 0.81 mg/dL (ref 0.44–1.00)
Calcium: 8.9 mg/dL (ref 8.9–10.3)
GFR calc Af Amer: >60 mL/min (ref >60)
GLUCOSE: 95 mg/dL (ref 65–99)
Potassium: 3.6 mmol/L (ref 3.5–5.1)
SODIUM: 139 mmol/L (ref 135–145)
Total Bilirubin: 0.6 mg/dL (ref 0.3–1.2)
Total Protein: 7.3 g/dL (ref 6.5–8.1)

## 2017-11-25 LAB — RAPID URINE DRUG SCREEN, HOSP PERFORMED
AMPHETAMINES: NOT DETECTED
BARBITURATES: NOT DETECTED
BENZODIAZEPINES: POSITIVE — AB
COCAINE: NOT DETECTED
Opiates: NOT DETECTED
Tetrahydrocannabinol: POSITIVE — AB

## 2017-11-25 LAB — ETHANOL: Alcohol, Ethyl (B): <10 mg/dL (ref <10)

## 2017-11-25 MED ORDER — ONDANSETRON HCL 4 MG PO TABS: 4.0000 mg | ORAL_TABLET | Freq: Three times a day (TID) | ORAL | Status: DC | PRN

## 2017-11-25 MED ORDER — NICOTINE 21 MG/24HR TD PT24: 21.0000 mg | MEDICATED_PATCH | Freq: Every day | TRANSDERMAL | Status: DC

## 2017-11-25 MED ORDER — ZOLPIDEM TARTRATE 5 MG PO TABS
5.0000 mg | ORAL_TABLET | Freq: Every evening | ORAL | Status: DC | PRN
  Administered 2017-11-25: 5 mg via ORAL
  Filled 2017-11-25: qty 1

## 2017-11-25 MED ORDER — ALUM & MAG HYDROXIDE-SIMETH 200-200-20 MG/5ML PO SUSP: 30.0000 mL | Freq: Four times a day (QID) | ORAL | Status: DC | PRN

## 2017-11-25 MED ORDER — IBUPROFEN 200 MG PO TABS: 600.0000 mg | ORAL_TABLET | Freq: Three times a day (TID) | ORAL | Status: DC | PRN

## 2017-11-25 NOTE — ED Provider Notes (Signed)
[facility] DEPT Provider Note   CSN: [id number] Arrival date & time: 11/25/17  1929     History   Chief Complaint Chief Complaint  Patient presents with  . Medical Clearance    IVC and HI/SI     HPI Hadiyah Maricle is a 37 y.o. female.  The history is provided by the patient. No language interpreter was used.   Eila Runyan is a 37 y.o. female who presents to the Emergency Department complaining of IVC. She presents to the emergency department under IVC by police. According to IVC papers she has been suffering from depression. She has been eating or sleeping or tending to personal hygiene. She took a handful of unknown pills today and send out a group text telling everyone goodbye. She has been cutting her wrist and thighs. His concern for safety. Patient denies any SI or HI. She states that there is nothing wrong with cutting herself. She does cut her thighs sometimes. She states she drink alcohol last night for her sister's birthday and took two Xanax last night. She does not wish to die. Past Medical History:  Diagnosis Date  . Ectopic pregnancy    Left fallopian tube  . Fibroids     Patient Active Problem List   Diagnosis Date Noted  . Atypical glandular cells of undetermined significance (AGUS) on cervical Pap smear 10/11/2016  . History of ectopic pregnancy 10/06/2016  . Class 2 obesity with body mass index (BMI) of 35.0 to 35.9 in adult 10/06/2016  . Smoker 07/16/2014    Past Surgical History:  Procedure Laterality Date  . DIAGNOSTIC LAPAROSCOPY WITH REMOVAL OF ECTOPIC PREGNANCY Right 07/23/2014   diagnostic laparoscopy and removal of hemoperitoneum only  . DILATION AND CURETTAGE OF UTERUS  2003     OB History    Gravida  5   Para  1   Term      Preterm  1   AB  2   Living  1     SAB  1   TAB      Ectopic  1   Multiple      Live Births  1            Home Medications    Prior to Admission medications     Not on File    Family History Family History  Problem Relation Age of Onset  . Cirrhosis Father   . Kidney failure Father     Social History Social History   Tobacco Use  . Smoking status: Current Every Day Smoker    Packs/day: 0.50    Types: Cigarettes  . Smokeless tobacco: Never Used  Substance Use Topics  . Alcohol use: Yes    Comment: socially  . Drug use: Yes    Types: Marijuana     Allergies   Iodine   Review of Systems Review of Systems  All other systems reviewed and are negative.    Physical Exam Updated Vital Signs BP (!) 133/98 (BP Location: Right Arm)   Pulse 88   Resp 16   SpO2 100%   Physical Exam  Constitutional: She is oriented to person, place, and time. She appears well-developed and well-nourished.  HENT:  Head: Normocephalic and atraumatic.  Cardiovascular: Normal rate and regular rhythm.  Pulmonary/Chest: Effort normal. No respiratory distress.  Musculoskeletal: She exhibits no edema.  Neurological: She is alert and oriented to person, place, and time.  Skin: Skin is warm and dry.  Psychiatric:  Agitated and tearful, shouting in the room. Denies SI.  Nursing note and vitals reviewed.    ED Treatments / Results  Labs (all labs ordered are listed, but only abnormal results are displayed) Labs Reviewed  ACETAMINOPHEN LEVEL - Abnormal; Notable for the following components:      Result Value   Acetaminophen (Tylenol), Serum <10 (*)    All other components within normal limits  RAPID URINE DRUG SCREEN, HOSP PERFORMED - Abnormal; Notable for the following components:   Benzodiazepines POSITIVE (*)    Tetrahydrocannabinol POSITIVE (*)    All other components within normal limits  CBC WITH DIFFERENTIAL/PLATELET - Abnormal; Notable for the following components:   MCV 76.4 (*)    MCH 24.8 (*)    RDW 17.2 (*)    All other components within normal limits  COMPREHENSIVE METABOLIC PANEL  ETHANOL  SALICYLATE LEVEL  POC URINE PREG,  ED    EKG None  Radiology No results found.  Procedures Procedures (including critical care time)  Medications Ordered in ED Medications  ibuprofen (ADVIL,MOTRIN) tablet 600 mg (has no administration in time range)  zolpidem (AMBIEN) tablet 5 mg (5 mg Oral Given 11/25/17 2336)  ondansetron (ZOFRAN) tablet 4 mg (has no administration in time range)  alum & mag hydroxide-simeth (MAALOX/MYLANTA) 200-200-20 MG/5ML suspension 30 mL (has no administration in time range)  nicotine (NICODERM CQ - dosed in mg/24 hours) patch 21 mg (has no administration in time range)     Initial Impression / Assessment and Plan / ED Course  I have reviewed the triage vital signs and the nursing notes.  Pertinent labs & imaging results that were available during my care of the patient were reviewed by me and considered in my medical decision making (see chart for details).     Patient here under IVC following possible suicide attempt by texting goodbye to multiple people. She states that the unknowing pills noted in the IVC were two Xanax last night. She denies any SI or HI. She is agitated and tearful in the department, shouting at times. She is redirectable. She has been medically cleared for psychiatric evaluation and treatment. No apparent toxidrome at this time.  Final Clinical Impressions(s) / ED Diagnoses   Final diagnoses:  None    ED Discharge Orders    None       Quintella Reichert, MD 11/26/17 [phone]

## 2017-11-25 NOTE — ED Triage Notes (Signed)
IVC pt comes in via [employer], hx of mental illness, depression took unknown pills, and sent texts saying "goodbye". Unknown time frame of events. Pt also cutting inner wrists and inner thighs.

## 2017-11-25 NOTE — ED Notes (Signed)
Bed: RW43 Expected date:  Expected time:  Means of arrival:  Comments: IVC/combative with [employer]

## 2017-11-25 NOTE — ED Notes (Signed)
Bed: EF20 Expected date:  Expected time:  Means of arrival:  Comments: 25

## 2017-11-25 NOTE — ED Notes (Signed)
SBAR Report received from previous nurse. Pt received calm and visible on unit. Pt denies current SI/ HI, A/V H, depression, anxiety, or pain at this time, and appears otherwise stable and free of distress.Pt upset she has to be here for the night, but took sleeping medication and was allowed phone call. Will continue to assess.

## 2017-11-26 ENCOUNTER — Inpatient Hospital Stay (HOSPITAL_COMMUNITY)
Admission: AD | Admit: 2017-11-26 | Discharge: 2017-11-29 | DRG: 885 | Disposition: A | Discharge status: Home / Self Care | Payer: 59 | Attending: Psychiatry | Admitting: Psychiatry

## 2017-11-26 ENCOUNTER — Other Ambulatory Visit: Payer: Self-pay

## 2017-11-26 ENCOUNTER — Encounter (HOSPITAL_COMMUNITY): Payer: Self-pay

## 2017-11-26 DIAGNOSIS — F1290 Cannabis use, unspecified, uncomplicated: Secondary | ICD-10-CM | POA: Diagnosis present

## 2017-11-26 DIAGNOSIS — F331 Major depressive disorder, recurrent, moderate: Secondary | ICD-10-CM | POA: Diagnosis not present

## 2017-11-26 DIAGNOSIS — Z915 Personal history of self-harm: Secondary | ICD-10-CM | POA: Diagnosis not present

## 2017-11-26 DIAGNOSIS — F322 Major depressive disorder, single episode, severe without psychotic features: Secondary | ICD-10-CM | POA: Diagnosis not present

## 2017-11-26 DIAGNOSIS — T424X2A Poisoning by benzodiazepines, intentional self-harm, initial encounter: Secondary | ICD-10-CM | POA: Diagnosis not present

## 2017-11-26 DIAGNOSIS — F1220 Cannabis dependence, uncomplicated: Secondary | ICD-10-CM | POA: Diagnosis not present

## 2017-11-26 DIAGNOSIS — F17210 Nicotine dependence, cigarettes, uncomplicated: Secondary | ICD-10-CM | POA: Diagnosis present

## 2017-11-26 DIAGNOSIS — Z841 Family history of disorders of kidney and ureter: Secondary | ICD-10-CM | POA: Diagnosis not present

## 2017-11-26 DIAGNOSIS — T1491XA Suicide attempt, initial encounter: Secondary | ICD-10-CM | POA: Diagnosis not present

## 2017-11-26 DIAGNOSIS — Z6379 Other stressful life events affecting family and household: Secondary | ICD-10-CM | POA: Diagnosis not present

## 2017-11-26 DIAGNOSIS — F332 Major depressive disorder, recurrent severe without psychotic features: Principal | ICD-10-CM | POA: Diagnosis present

## 2017-11-26 DIAGNOSIS — R450 Nervousness: Secondary | ICD-10-CM | POA: Diagnosis not present

## 2017-11-26 DIAGNOSIS — F419 Anxiety disorder, unspecified: Secondary | ICD-10-CM | POA: Diagnosis not present

## 2017-11-26 DIAGNOSIS — T424X4A Poisoning by benzodiazepines, undetermined, initial encounter: Secondary | ICD-10-CM | POA: Diagnosis not present

## 2017-11-26 LAB — PREGNANCY, URINE: PREG TEST UR: NEGATIVE

## 2017-11-26 MED ORDER — ACETAMINOPHEN 325 MG PO TABS: 650.0000 mg | ORAL_TABLET | Freq: Four times a day (QID) | ORAL | Status: DC | PRN

## 2017-11-26 MED ORDER — MAGNESIUM HYDROXIDE 400 MG/5ML PO SUSP: 30.0000 mL | Freq: Every day | ORAL | Status: DC | PRN

## 2017-11-26 MED ORDER — OLANZAPINE 10 MG PO TBDP: 10.0000 mg | ORAL_TABLET | Freq: Three times a day (TID) | ORAL | Status: DC | PRN

## 2017-11-26 MED ORDER — OLANZAPINE 10 MG PO TBDP
10.0000 mg | ORAL_TABLET | Freq: Three times a day (TID) | ORAL | Status: DC | PRN
  Administered 2017-11-27 – 2017-11-28 (×2): 10 mg via ORAL
  Filled 2017-11-26 (×2): qty 1

## 2017-11-26 MED ORDER — TRAZODONE HCL 50 MG PO TABS
50.0000 mg | ORAL_TABLET | Freq: Every evening | ORAL | Status: DC | PRN
  Administered 2017-11-26 – 2017-11-28 (×3): 50 mg via ORAL
  Filled 2017-11-26 (×2): qty 1

## 2017-11-26 MED ORDER — NICOTINE 21 MG/24HR TD PT24
21.0000 mg | MEDICATED_PATCH | Freq: Every day | TRANSDERMAL | Status: DC
  Filled 2017-11-26 (×6): qty 1

## 2017-11-26 MED ORDER — HYDROXYZINE HCL 25 MG PO TABS
25.0000 mg | ORAL_TABLET | Freq: Three times a day (TID) | ORAL | Status: DC | PRN
  Administered 2017-11-26 – 2017-11-29 (×5): 25 mg via ORAL
  Filled 2017-11-26 (×5): qty 1

## 2017-11-26 MED ORDER — ALUM & MAG HYDROXIDE-SIMETH 200-200-20 MG/5ML PO SUSP: 30.0000 mL | ORAL | Status: DC | PRN

## 2017-11-26 MED ORDER — ONDANSETRON HCL 4 MG PO TABS: 4.0000 mg | ORAL_TABLET | Freq: Three times a day (TID) | ORAL | Status: DC | PRN

## 2017-11-26 NOTE — ED Notes (Signed)
Awake, alert, eating breakfast, calm, cooperative.
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
## 2017-11-26 NOTE — ED Notes (Signed)
Called for transport

## 2017-11-26 NOTE — ED Notes (Signed)
Visitor at pt bedside.

## 2017-11-26 NOTE — BH Assessment (Signed)
[facility] Assessment Progress Note    Per Dr Corena Pilgrim, patient needs psychiatric inpatient on a [facility] [place] when one becomes available.

## 2017-11-26 NOTE — ED Notes (Signed)
Report given to Patty RN.

## 2017-11-26 NOTE — ED Notes (Signed)
Pt gave cellphone to her girlfriend to take home to pt son.

## 2017-11-26 NOTE — ED Notes (Signed)
Pt expresses concerns about getting home in a timely manner, pt states she is concerned about her job and her child while she is in the hospital. Pt is calm and cooperative at this time.
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
## 2017-11-26 NOTE — BH Assessment (Signed)
[place] Assessment Progress Note   Patient can be admitted to [facility] [id number] at 2 pm

## 2017-11-26 NOTE — Tx Team (Signed)
Initial Treatment Plan 11/26/2017 4:57 PM Tollie Pizza [id number]    PATIENT STRESSORS: Marital or family conflict Other: Anniversary of important loss (deceased father)   PATIENT STRENGTHS: Ability for insight Average or above average intelligence Capable of independent living [place] of knowledge Motivation for treatment/growth Physical Health Religious Affiliation Supportive family/friends   PATIENT IDENTIFIED PROBLEMS: "medication management"  "out-patient therapy"  "being treated with respect"  Ineffective Coping  Depression  Anxiety           DISCHARGE CRITERIA:  Ability to meet basic life and health needs Adequate post-discharge living arrangements Improved stabilization in mood, thinking, and/or behavior Medical problems require only outpatient monitoring Motivation to continue treatment in a less acute level of care Safe-care adequate arrangements made Verbal commitment to aftercare and medication compliance  PRELIMINARY DISCHARGE PLAN: Outpatient therapy  PATIENT/FAMILY INVOLVEMENT: This treatment plan has been presented to and reviewed with the patient, Jessica Melton.  The patient and family have been given the opportunity to ask questions and make suggestions.  Annia Friendly, RN 11/26/2017, 4:57 PM

## 2017-11-27 DIAGNOSIS — F1220 Cannabis dependence, uncomplicated: Secondary | ICD-10-CM

## 2017-11-27 DIAGNOSIS — Z6379 Other stressful life events affecting family and household: Secondary | ICD-10-CM

## 2017-11-27 DIAGNOSIS — F322 Major depressive disorder, single episode, severe without psychotic features: Secondary | ICD-10-CM

## 2017-11-27 DIAGNOSIS — T424X4A Poisoning by benzodiazepines, undetermined, initial encounter: Secondary | ICD-10-CM

## 2017-11-27 DIAGNOSIS — F17210 Nicotine dependence, cigarettes, uncomplicated: Secondary | ICD-10-CM

## 2017-11-27 NOTE — Progress Notes (Signed)
Pt attend wrap up group. Her day was 8. Her goal was to find peace today. She has had so many obstacles today. She has a support system to make her day and help her to try to get to next step. Pt said she glad she came.
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
## 2017-11-27 NOTE — BHH Counselor (Signed)
PSA not conducted as plan is for patient d/c within 48 hours of admission.

## 2017-11-27 NOTE — Progress Notes (Signed)
D:  Patient's self inventory sheet, patient sleeps good, sleep medication helpful.  Fair appetite, normal energy level, good concentration.  Rated depression 3, denied hopeless, anxiety #1.  Denied withdrawals.  Denied SI.  Denied physical problems.  Denied physical pain.  Goal is not allow others to determine her moods.  Plans to focus on getting back to her son, no distractions.  Does have discharge plans.  Plans regulating medication and controlling triggers.  A:  Medications administered per MD orders.  Emotional support and encouragement given patient. R:  Patient denied SI and HI, contracts for safety.  Denied A/V hallucinations.  Safety maintained with 15 minute checks.

## 2017-11-27 NOTE — Plan of Care (Signed)
Nurse discussed depression, anxiety, coping skills with patient.  
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
## 2017-11-27 NOTE — Progress Notes (Signed)
Adult Psychoeducational Group Note  Date:  11/27/2017 Time:  9:38 PM  Group Topic/Focus:  Wrap-Up Group:   The focus of this group is to help patients review their daily goal of treatment and discuss progress on daily workbooks.  Participation Level:  Active  Participation Quality:  Appropriate  Affect:  Appropriate  Cognitive:  Alert  Insight: Appropriate  Engagement in Group:  Engaged  Modes of Intervention:  Discussion  Additional Comments:  Pt stated that her day was good until visitation time. Pt was informed that her mother has been calling all family members telling them not to talk to her or come visit her.   Sharmon Revere 11/27/2017, 9:38 PM

## 2017-11-27 NOTE — Progress Notes (Signed)
Recreation Therapy Notes  Date: 4.15.19 Time: 9:30 a.m. Location: [address] Dayroom   Group Topic: Stress Management   Goal Area(s) Addresses:  Goal 1.1: To reduce stress  -Patient will feel a reduction in stress level  -Patient will understand the importance of stress management  -Patient will participate during stress management group      Intervention: Stress Management  Activity: Guided Imagery: Patients were in a peaceful environment with soft lighting enhancing patients mood. Patients listened to a guided imagery script read by Recreation Therapy Intern.  Education: Stress Management, Discharge Planning.    Education Outcome: Acknowledges edcuation/In group clarification offered/Needs additional education   Clinical Observations/Feedback:: Patient did not attend     Ranell Patrick, Recreation Therapy Intern   Ranell Patrick 11/27/2017 9:11 AM

## 2017-11-27 NOTE — Progress Notes (Signed)
Adult Psychoeducational Group Note  Date:  11/27/2017 Time:  1:34 PM  Group Topic/Focus:  Overcoming Stress:   The focus of this group is to define stress and help patients assess their triggers.  Participation Level:  Active  Participation Quality:  Attentive  Affect:  Appropriate  Cognitive:  Appropriate  Insight: Good  Engagement in Group:  Engaged  Modes of Intervention:  Discussion  Additional Comments:  Pt participated in group. Pt was very engaged as evidenced by her talking about why she is here and by giving feedback to her peers.  Jessica Melton 11/27/2017, 1:34 PM

## 2017-11-27 NOTE — B[facility] Group Notes (Signed)
Adult Psychoeducational Group Note  Date:  11/27/2017 Time:  4:53 PM  Group Topic/Focus:  Wellness Toolbox:   The focus of this group is to discuss various aspects of wellness, balancing those aspects and exploring ways to increase the ability to experience wellness.  Patients will create a wellness toolbox for use upon discharge.  Participation Level:  Active  Participation Quality:  Appropriate  Affect:  Appropriate  Cognitive:  Alert  Insight: Appropriate  Engagement in Group:  Engaged  Modes of Intervention:  Discussion  Additional Comments:  Pt attended and participated in psycho-educational group.  Huel Cote 11/27/2017, 4:53 PM

## 2017-11-27 NOTE — [facility] Suicide Risk Assessment (Signed)
[place] INPATIENT:  Family/Significant Other Suicide Prevention Education  Suicide Prevention Education:   Education Completed; "Q" Elisabeth Cara, [place],  [phone]  has been identified by the patient as  the family member/significant other with whom the patient will be residing, and identified as the person(s) who will aid the patient in the event of a mental health crisis (suicidal ideations/suicide attempt).  With written consent from the patient, the family member/significant other has been provided the following suicide prevention education, prior to the and/or following the discharge of the patient.  The suicide prevention education provided includes the following:  Suicide risk factors  Suicide prevention and interventions  National Suicide Hotline telephone number  [facility] assessment telephone number  [facility] Emergency Assistance [place] and/or Residential Mobile Crisis Unit telephone number  Request made of family/significant other to:  Remove weapons (e.g., guns, rifles, knives), all items previously/currently identified as safety concern.    Remove drugs/medications (over-the-counter, prescriptions, illicit drugs), all items previously/currently identified as a safety concern.  The family member/significant other verbalizes understanding of the suicide prevention education information provided.  The family member/significant other agrees to remove the items of safety concern listed above. Q states that she has no safety concerns re: patient, and is happy to receive her home.  She furthermore states that she is concerned about her depression, and believes that medications and therapy would be helpful.  CSW assured her that patient would have hospital follow up appointment at d/c. Went over crises plan and recommendations. Q confirmed there are no guns in the home. Trish Mage 11/27/2017, 2:31 PM

## 2017-11-28 NOTE — [facility] Group Notes (Signed)
LCSW Group Therapy Note 11/28/2017 3:51 PM  Type of Therapy/Topic: Group Therapy: Balance in Life  Participation Level: Did Not Attend  Description of Group:  This group will address the concept of balance and how it feels and looks when one is unbalanced. Patients will be encouraged to process areas in their lives that are out of balance and identify reasons for remaining unbalanced. Facilitators will guide patients in utilizing problem-solving interventions to address and correct the stressor making their life unbalanced. Understanding and applying boundaries will be explored and addressed for obtaining and maintaining a balanced life. Patients will be encouraged to explore ways to assertively make their unbalanced needs known to significant others in their lives, using other group members and facilitator for support and feedback.  Therapeutic Goals: 1. Patient will identify two or more emotions or situations they have that consume much of in their lives. 2. Patient will identify signs/triggers that life has become out of balance:  3. Patient will identify two ways to set boundaries in order to achieve balance in their lives:  4. Patient will demonstrate ability to communicate their needs through discussion and/or role plays  Summary of Patient Progress:  Invited, chose not to attend.    Therapeutic Modalities:  Cognitive Behavioral Therapy Solution-Focused Therapy Assertiveness Training   Theresa Duty Clinical Social Worker
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
## 2017-11-28 NOTE — Progress Notes (Signed)
Pt has been talking on the phone at various times most of the evening.  She says she had a bad visit with her girlfriend who told her she was going to go back home and do away with pt's belongings.  She requested Vistaril at the beginning of the shift which was given.  She was given Trazodone at bedtime and writer thought she was asleep, but she came up just a few minutes ago stating that she was not able to go to sleep thinking about her conversation with her girlfriend.  She says her teenage son is there in the home also.  Writer talked with her a few minutes and then gave her Zyprexa 10 mg per order.  Pt denies SI/HI/AVH and says the doctor is going to discharge her tomorrow.  Support and encouragement offered.  Discharge plans are in process.  Safety maintained with q15 minute checks.

## 2017-11-28 NOTE — BHH Group Notes (Signed)
Adult Psychoeducational Group Note  Date:  11/28/2017 Time:  10:05 AM  Group Topic/Focus:  Goals Group:   The focus of this group is to help patients establish daily goals to achieve during treatment and discuss how the patient can incorporate goal setting into their daily lives to aide in recovery.  Participation Level:  Active  Participation Quality:  Appropriate  Affect:  Appropriate  Cognitive:  Alert  Insight: Appropriate  Engagement in Group:  Engaged  Modes of Intervention:  Orientation  Additional Comments:   Pt attended and participated in orientation/goals group. Pt goal for today is to work on her anger and get anger management tips.   Huel Cote 11/28/2017, 10:05 AM

## 2017-11-28 NOTE — Plan of Care (Signed)
Pt is irritable and guarded requiring medication for agitation after arguing during a phone call.

## 2017-11-28 NOTE — Progress Notes (Signed)
Adult Psychoeducational Group Note  Date:  11/28/2017 Time:  4:55 PM  Group Topic/Focus:  Healthy Communication:   The focus of this group is to discuss communication, barriers to communication, as well as healthy ways to communicate with others.  Participation Level:  Active  Participation Quality:  Appropriate  Affect:  Appropriate  Cognitive:  Appropriate and Disorganized  Insight: Appropriate  Engagement in Group:  Engaged  Modes of Intervention:  Discussion  Additional Comments:  Pt participated in a game as a way of building healthy communication skills.  Tonia Brooms D 11/28/2017, 4:55 PM

## 2017-11-28 NOTE — Progress Notes (Signed)
Pt presents with a flat affect and anxious mood. Pt reported difficulty sleeping last night because she was worried about her girlfriend tearing up her belongings. Pt expressed that her and her girlfriend got into an argument during visitation yesterday and her girlfriend stated that she (the pt) would be going home to an empty house. Pt denies SI/HI. Orders reviewed. Verbal support provided. Pt encouraged to attend groups. 15 minute checks performed for safety. Pt compliant with tx plan.

## 2017-11-28 NOTE — Progress Notes (Signed)
Recreation Therapy Notes  Animal-Assisted Activity (AAA) Program Checklist/Progress Notes Patient Eligibility Criteria Checklist & Daily Group note for Rec Tx Intervention  Date: 4.16.19 Time: 2:45 p.m. Location: [place]   AAA/T Program Assumption of Risk Form signed by Patient/ or Parent Legal Guardian Yes  Patient is free of allergies or sever asthma Yes  Patient reports no fear of animals Yes  Patient reports no history of cruelty to animals Yes  Patient understands his/her participation is voluntary Yes  Patient washes hands before animal contact Yes  Patient washes hands after animal contact Yes  Behavioral Response: Appropriate   Education: Hand Washing, Appropriate Animal Interaction   Education Outcome: Acknowledges Education.   Clinical Observations/Feedback: Patient attended session and interacted appropriately with therapy dog and peers   Ranell Patrick, Recreation Therapy Intern   Ranell Patrick 11/28/2017 2:39 PM

## 2017-11-29 DIAGNOSIS — T424X2A Poisoning by benzodiazepines, intentional self-harm, initial encounter: Secondary | ICD-10-CM

## 2017-11-29 DIAGNOSIS — F419 Anxiety disorder, unspecified: Secondary | ICD-10-CM

## 2017-11-29 DIAGNOSIS — T1491XA Suicide attempt, initial encounter: Secondary | ICD-10-CM

## 2017-11-29 DIAGNOSIS — F1290 Cannabis use, unspecified, uncomplicated: Secondary | ICD-10-CM

## 2017-11-29 DIAGNOSIS — R450 Nervousness: Secondary | ICD-10-CM

## 2017-11-29 MED ORDER — HYDROXYZINE HCL 25 MG PO TABS: 25.0000 mg | ORAL_TABLET | Freq: Three times a day (TID) | ORAL | 0 refills | Status: DC | PRN

## 2017-11-29 MED ORDER — TRAZODONE HCL 50 MG PO TABS: 50.0000 mg | ORAL_TABLET | Freq: Every evening | ORAL | 0 refills | Status: DC | PRN

## 2017-11-29 MED ORDER — HYDROXYZINE HCL 25 MG PO TABS: 25.0000 mg | ORAL_TABLET | Freq: Three times a day (TID) | ORAL | 0 refills | Status: AC | PRN

## 2017-11-29 MED ORDER — TRAZODONE HCL 50 MG PO TABS: 50.0000 mg | ORAL_TABLET | Freq: Every evening | ORAL | 0 refills | Status: AC | PRN

## 2017-11-29 NOTE — Discharge Instructions (Signed)
Major Depressive Disorder, Adult Major depressive disorder (MDD) is a mental health condition. It may also be called clinical depression or unipolar depression. MDD usually causes feelings of sadness, hopelessness, or helplessness. MDD can also cause physical symptoms. It can interfere with work, school, relationships, and other everyday activities. MDD may be mild, moderate, or severe. It may occur once (single episode major depressive disorder) or it may occur multiple times (recurrent major depressive disorder). What are the causes? The exact cause of this condition is not known. MDD is most likely caused by a combination of things, which may include:  Genetic factors. These are traits that are passed along from parent to child.  Individual factors. Your personality, your behavior, and the way you handle your thoughts and feelings may contribute to MDD. This includes personality traits and behaviors learned from others.  Physical factors, such as: ? Differences in the part of your brain that controls emotion. This part of your brain may be different than it is in people who do not have MDD. ? Long-term (chronic) medical or psychiatric illnesses.  Social factors. Traumatic experiences or major life changes may play a role in the development of MDD.  What increases the risk? This condition is more likely to develop in women. The following factors may also make you more likely to develop MDD:  A family history of depression.  Troubled family relationships.  Abnormally low levels of certain brain chemicals.  Traumatic events in childhood, especially abuse or the loss of a parent.  Being under a lot of stress, or long-term stress, especially from upsetting life experiences or losses.  A history of: ? Chronic physical illness. ? Other mental health disorders. ? Substance abuse.  Poor living conditions.  Experiencing social exclusion or discrimination on a regular basis.  What are  the signs or symptoms? The main symptoms of MDD typically include:  Constant depressed or irritable mood.  Loss of interest in things and activities.  MDD symptoms may also include:  Sleeping or eating too much or too little.  Unexplained weight change.  Fatigue or low energy.  Feelings of worthlessness or guilt.  Difficulty thinking clearly or making decisions.  Thoughts of suicide or of harming others.  Physical agitation or weakness.  Isolation.  Severe cases of MDD may also occur with other symptoms, such as:  Delusions or hallucinations, in which you imagine things that are not real (psychotic depression).  Low-level depression that lasts at least a year (chronic depression or persistent depressive disorder).  Extreme sadness and hopelessness (melancholic depression).  Trouble speaking and moving (catatonic depression).  How is this diagnosed? This condition may be diagnosed based on:  Your symptoms.  Your medical history, including your mental health history. This may involve tests to evaluate your mental health. You may be asked questions about your lifestyle, including any drug and alcohol use, and how long you have had symptoms of MDD.  A physical exam.  Blood tests to rule out other conditions.  You must have a depressed mood and at least four other MDD symptoms most of the day, nearly every day in the same 2-week timeframe before your health care provider can confirm a diagnosis of MDD. How is this treated? This condition is usually treated by mental health professionals, such as psychologists, psychiatrists, and clinical social workers. You may need more than one type of treatment. Treatment may include:  Psychotherapy. This is also called talk therapy or counseling. Types of psychotherapy include: ? Cognitive behavioral   therapy (CBT). This type of therapy teaches you to recognize unhealthy feelings, thoughts, and behaviors, and replace them with  positive thoughts and actions. ? Interpersonal therapy (IPT). This helps you to improve the way you relate to and communicate with others. ? Family therapy. This treatment includes members of your family.  Medicine to treat anxiety and depression, or to help you control certain emotions and behaviors.  Lifestyle changes, such as: ? Limiting alcohol and drug use. ? Exercising regularly. ? Getting plenty of sleep. ? Making healthy eating choices. ? Spending more time outdoors.  Treatments involving stimulation of the brain can be used in situations with extremely severe symptoms, or when medicine or other therapies do not work over time. These treatments include electroconvulsive therapy, transcranial magnetic stimulation, and vagal nerve stimulation. Follow these instructions at home: Activity  Return to your normal activities as told by your health care provider.  Exercise regularly and spend time outdoors as told by your health care provider. General instructions  Take over-the-counter and prescription medicines only as told by your health care provider.  Do not drink alcohol. If you drink alcohol, limit your alcohol intake to no more than 1 drink a day for nonpregnant women and 2 drinks a day for men. One drink equals 12 oz of beer, 5 oz of wine, or 1 oz of hard liquor. Alcohol can affect any antidepressant medicines you are taking. Talk to your health care provider about your alcohol use.  Eat a healthy diet and get plenty of sleep.  Find activities that you enjoy doing, and make time to do them.  Consider joining a support group. Your health care provider may be able to recommend a support group.  Keep all follow-up visits as told by your health care provider. This is important. Where to find more information: National Alliance on Mental Illness  www.nami.org  U.S. National Institute of Mental Health  www.nimh.nih.gov  National Suicide Prevention  Lifeline  1-800-273-TALK (8255). This is free, 24-hour help.  Contact a health care provider if:  Your symptoms get worse.  You develop new symptoms. Get help right away if:  You self-harm.  You have serious thoughts about hurting yourself or others.  You see, hear, taste, smell, or feel things that are not present (hallucinate). This information is not intended to replace advice given to you by your health care provider. Make sure you discuss any questions you have with your health care provider. Document Released: 11/26/2012 Document Revised: 04/07/2016 Document Reviewed: 02/10/2016 Elsevier Interactive Patient Education  2018 Elsevier Inc.  

## 2017-11-29 NOTE — Progress Notes (Signed)
  DATA ACTION RESPONSE  Objective- Pt. is visible in the dayroom, seen talking on the phone.Presents with an animated/labile affect and mood. Pt gets easily irritable at times. Pt states she hopes to be d/c soon. C/o of anxiety and insomnia this evening.  Subjective- Denies having any SI/HI/AVH/Pain at this time.Is cooperative and remains safe on the unit.  1:1 interaction in private to establish rapport. Encouragement, education, & support given from staff.  PRN vistaril and trazodone  requested and will re-eval accordingly.   Safety maintained with Q 15 checks. Continue with POC.

## 2017-11-29 NOTE — BHH Suicide Risk Assessment (Signed)
[facility] Discharge Suicide Risk Assessment   Principal Problem: MDD (major depressive disorder), single episode, severe , no psychosis ([place]) Discharge Diagnoses:  Patient Active Problem List   Diagnosis Date Noted  . Major depressive disorder, recurrent episode, severe ([place]) [F33.2] 11/26/2017  . Moderate cannabis use disorder ([place]) [F12.20] 11/26/2017  . MDD (major depressive disorder), single episode, severe , no psychosis ([place]) [F32.2] 11/26/2017  . Atypical glandular cells of undetermined significance (AGUS) on cervical Pap smear [R87.619] 10/11/2016  . History of ectopic pregnancy [Z87.59] 10/06/2016  . Class 2 obesity with body mass index (BMI) of 35.0 to 35.9 in adult [E66.9, Z68.35] 10/06/2016  . Smoker [F17.200] 07/16/2014    Total Time spent with patient: 30 minutes  Musculoskeletal: Strength & Muscle Tone: within normal limits Gait & Station: normal Patient leans: N/A  Psychiatric Specialty Exam: ROS no headache, no chest pain, no shortness of breath, no vomiting, no rash  Blood pressure 100/67, pulse 84, temperature 97.9 F (36.6 C), temperature source Oral, resp. rate 16, height 5' 6.5" (1.689 m), weight 113.4 kg (250 lb), SpO2 100 %, unknown if currently breastfeeding.Body mass index is 39.75 kg/m.  General Appearance: Well Groomed  Eye Contact::  Good  Speech:  Normal Rate409  Volume:  Normal  Mood:  improved, describes mood as 6/10  Affect:  more reactive, still slightly irritable  Thought Process:  Linear and Descriptions of Associations: Intact  Orientation:  Full (Time, Place, and Person)  Thought Content:  no hallucinations, no delusions, not internally preoccupied   Suicidal Thoughts:  No denies any suicidal or self injurious ideations, states " I have a lot to live for"  Homicidal Thoughts:  No denies any homicidal or violent ideations, and in particular also denies any HI towards brother or any family member   Memory:  recent and remote grossly intact    Judgement:  Other:  improving   Insight:  improving  Psychomotor Activity:  Normal  Concentration:  Good  Recall:  Good  Fund of Knowledge:Good  Language: Good  Akathisia:  Negative  Handed:  Right  AIMS (if indicated):     Assets:  Communication Skills Desire for Improvement Resilience  Sleep:  Number of Hours: 6.75  Cognition: WNL  ADL's:  Intact   Mental Status Per Nursing Assessment::   On Admission:  Suicidal ideation indicated by others, Self-harm behaviors  Demographic Factors:  37 year old divorced female, has one son aged 28, employed   Loss Factors: Family stressors  Historical Factors: History of one prior psychiatric admission 14 years ago for depression, SI  Risk Reduction Factors:   Responsible for children under 67 years of age, Sense of responsibility to family, Employed, Living with another person, especially a relative and Positive coping skills or problem solving skills  Continued Clinical Symptoms:  At this time patient is alert, attentive, presents calm, mood is described as improved compared to admission, affect is more reactive and not as irritable today. No thought disorder, no suicidal or self injurious ideations, no homicidal or violent ideations, no psychotic symptoms, future oriented, plans to return to work early next week. She is currently not on any standing psychiatric medications. Behavior on unit in control, presents polite on approach. As noted in previous notes, with patient's consent collateral information has been obtained from her SO and her stepfather- they have corroborated patient is improved/stabilizing and agree with discharge. Also, after SO and patient's son came to visit her yesterday evening she felt significantly relieved and calmer-  states she had been concerned her mother might try to take her son away while she was in hospital and was relieved this did not occur .   Cognitive Features That Contribute To Risk:  No gross  cognitive deficits noted upon discharge. Is alert , attentive, and oriented x 3   Suicide Risk:  Mild:  Suicidal ideation of limited frequency, intensity, duration, and specificity.  There are no identifiable plans, no associated intent, mild dysphoria and related symptoms, good self-control (both objective and subjective assessment), few other risk factors, and identifiable protective factors, including available and accessible social support.  [place] Follow up on 12/12/2017.   Why:  Tuesday at [place] with [facility] M for medication management.  Contact information: [place] [address] [phone]           Plan Of Care/Follow-up recommendations:  Activity:  as tolerated Diet:  regular Tests:  NA Other:  See below  Patient is expressing readiness for discharge and there are no current or ongoing grounds for involuntary commitment She plans to return home Follow up as above .   Jenne Campus, MD 11/29/2017, 8:19 AM

## 2017-11-29 NOTE — Progress Notes (Signed)
Recreation Therapy Notes  Date: 4.17.19 Time: 9:30 a.m. Location: [address] Dayroom   Group Topic: Stress Management   Goal Area(s) Addresses:  Goal 1.1: To reduce stress  -Patient will feel a reduction in stress level  -Patient will understand the importance of stress management  -Patient will participate during stress management group      Intervention: Stress Management  Activity: Guided Imagery: Patients were in a peaceful environment with soft lighting enhancing patients mood. Patients listened to a deep concentration meditation from the calm app.  Education: Stress Management, Discharge Planning.    Education Outcome: Acknowledges edcuation/In group clarification offered/Needs additional education   Clinical Observations/Feedback:: Patient did not attend     Ranell Patrick, Recreation Therapy Intern   Ranell Patrick 11/29/2017 8:38 AM

## 2017-11-29 NOTE — Progress Notes (Signed)
  [facility] Adult Case Management Discharge Plan :  Will you be returning to the same living situation after discharge:  Yes,  patient plans to return to her home at discharge At discharge, do you have transportation home?: Yes,  patient reports that her friend will pick her up at discharge Do you have the ability to pay for your medications: Yes,  [facility]  Release of information consent forms completed and in the chart;  Patient's signature needed at discharge.  Patient to Follow up at: [employer] Follow up on 12/12/2017.   Why:  Tuesday at [place] with [place] M for medication management.  Contact information: [address] [place] [address] [phone]        [place] Follow up on 11/30/2017.   Why:  Appointment is 11/30/17 at 1:30 pm with [facility].  Contact information: [place] [address] [phone]           Next level of care provider has access to [place] and Suicide Prevention discussed: Yes,  with the patient's significant other  Have you used any form of tobacco in the last 30 days? (Cigarettes, Smokeless Tobacco, Cigars, and/or Pipes): Yes  Has patient been referred to the Quitline?: Patient refused referral  Patient has been referred for addiction treatment: N/A  Marylee Floras, [place] 11/29/2017, 10:51 AM

## 2017-11-29 NOTE — BHH Group Notes (Signed)
[facility] Mental Health Association Group Therapy      11/29/2017 11:41 AM  Type of Therapy: Mental Health Association Presentation  Participation Level: Active  Participation Quality: Attentive  Affect: Appropriate  Cognitive: Oriented  Insight: Developing/Improving  Engagement in Therapy: Engaged  Modes of Intervention: Discussion, Education and Socialization  Summary of Progress/Problems: [place] ([place]) Speaker came to talk about his personal journey with mental health. The pt processed ways by which to relate to the speaker. The [place] speaker provided handouts and educational information pertaining to groups and services offered by the [facility]. Pt was engaged in speaker's presentation and was receptive to resources provided.    [place] Social Worker

## 2017-11-29 NOTE — Progress Notes (Signed)
Pt received both written and verbal discharge instructions. Pt verbalized understanding of d/c instructions. Pt agreed to f/u appt and med regimen. Pt received prescriptions and d/c packet. Pt gathered belongings from room and locker. Pt safely discharged to the lobby.
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
## 2017-12-25 ENCOUNTER — Encounter: Payer: Self-pay | Admitting: Urgent Care

## 2017-12-26 ENCOUNTER — Ambulatory Visit: Payer: Self-pay | Admitting: Urgent Care

## 2020-07-30 ENCOUNTER — Ambulatory Visit (HOSPITAL_COMMUNITY): Payer: Self-pay

## 2023-08-28 ENCOUNTER — Ambulatory Visit: Payer: Medicaid Other | Admitting: Obstetrics and Gynecology

## 2023-12-27 ENCOUNTER — Other Ambulatory Visit: Payer: Self-pay | Admitting: Physician Assistant

## 2023-12-27 DIAGNOSIS — Z1231 Encounter for screening mammogram for malignant neoplasm of breast: Secondary | ICD-10-CM

## 2024-01-19 ENCOUNTER — Ambulatory Visit

## 2024-07-30 ENCOUNTER — Inpatient Hospital Stay: Attending: Nurse Practitioner | Admitting: Nurse Practitioner

## 2024-07-30 ENCOUNTER — Encounter: Payer: Self-pay | Admitting: Nurse Practitioner

## 2024-07-30 ENCOUNTER — Inpatient Hospital Stay

## 2024-07-30 VITALS — BP 105/66 | HR 103 | Temp 98.9°F | Resp 17 | Wt 258.9 lb

## 2024-07-30 DIAGNOSIS — Z832 Family history of diseases of the blood and blood-forming organs and certain disorders involving the immune mechanism: Secondary | ICD-10-CM | POA: Diagnosis not present

## 2024-07-30 DIAGNOSIS — D509 Iron deficiency anemia, unspecified: Secondary | ICD-10-CM | POA: Diagnosis present

## 2024-07-30 DIAGNOSIS — F17210 Nicotine dependence, cigarettes, uncomplicated: Secondary | ICD-10-CM | POA: Insufficient documentation

## 2024-07-30 DIAGNOSIS — N920 Excessive and frequent menstruation with regular cycle: Secondary | ICD-10-CM | POA: Diagnosis present

## 2024-07-30 DIAGNOSIS — Z800 Family history of malignant neoplasm of digestive organs: Secondary | ICD-10-CM | POA: Insufficient documentation

## 2024-07-30 DIAGNOSIS — Z79899 Other long term (current) drug therapy: Secondary | ICD-10-CM | POA: Diagnosis not present

## 2024-07-30 DIAGNOSIS — D500 Iron deficiency anemia secondary to blood loss (chronic): Secondary | ICD-10-CM

## 2024-07-30 MED ORDER — ACCRUFER 30 MG PO CAPS: 1.0000 | ORAL_CAPSULE | Freq: Two times a day (BID) | ORAL | 0 refills | Status: AC

## 2024-07-30 NOTE — Progress Notes (Cosign Needed)
 [facility]   [phone] [phone]   Clinic New consult Note   Patient Care Team: Patient, No Pcp Per as PCP - General (General Practice) 07/30/2024  CHIEF COMPLAINTS/PURPOSE OF CONSULTATION:  IDA, referred by PCP Rosina Amy, PA  HISTORY OF PRESENTING ILLNESS:  Jessica Melton 43 y.o. female with PMH including dyslipidemia, dysmenorrhea, asthma, and depression is here because of iron deficiency anemia.  She has had anemia forever.  Labs show a mild intermittent anemia since at least 2015-2019, Hgb range 10.6-12.3 with microcytosis MCV as low as 67.  At times MCV low with a normal hemoglobin.  CBC 01/15/2021 showed Hgb 11.2, MCV 75.7, normal platelet count.  Hemoglobin began trending down further to the 9 range 06/04/2023 to 10/05/2023, MCV down to 68 with elevated RDW.  Labs 09/18/2023 showed serum iron 24, TIBC 426, 6% saturation, and ferritin of 15.  She was prescribed oral iron.  Most recently 07/01/2024 serum iron 37, TIBC 409, 9% saturation, and ferritin of 15 without much improvement in her CBC, Hgb 9.4, MCV 69 with normal platelet count and differential.  She has tried oral iron intermittently over 20 years, but nothing in the past 5 years due to constipation.  She donated blood in the past but not recently.  She has dysmenorrhea with heavy painful periods and nausea due to fibroids.  LMP ended a week ago, changes products every to 1.5 hours for 3 days.  Bleeding has not lightened but duration is shorter in the past year, she used to have periods last 1 to 2 weeks.  Has not tried OCP or IUD to manage bleeding.  Socially she is single, works in research scientist (physical sciences), independent with ADLs.  Has a healthy son.  Mother and grandmother had anemia.  An aunt recently passed from pancreatic cancer.  Patient is up-to-date on Pap smears but not mammograms.  Drinks alcohol on the weekends, current daily cigarette smoker half pack per day x 8 years and marijuana  use.  Today she presents with a friend.  Has been experiencing dizziness and nausea related to vertigo since August.  She cannot eat much due to the nausea.  Denies change in her bowel habits, any bleeding, recent infection or abdominal surgery.  She has intermittent fatigue and shortness of breath but functions well without difficulty.    MEDICAL HISTORY:  Past Medical History:  Diagnosis Date   Ectopic pregnancy    Left fallopian tube   Fibroids     SURGICAL HISTORY: Past Surgical History:  Procedure Laterality Date   DIAGNOSTIC LAPAROSCOPY WITH REMOVAL OF ECTOPIC PREGNANCY Right 07/23/2014   diagnostic laparoscopy and removal of hemoperitoneum only   DILATION AND CURETTAGE OF UTERUS  2003    SOCIAL HISTORY: Social History   Socioeconomic History   Marital status: Single    Spouse name: Not on file   Number of children: Not on file   Years of education: Not on file   Highest education level: Not on file  Occupational History   Not on file  Tobacco Use   Smoking status: Every Day    Current packs/day: 0.50    Types: Cigarettes   Smokeless tobacco: Never  Substance and Sexual Activity   Alcohol use: Yes    Comment: socially   Drug use: Yes    Types: Marijuana   Sexual activity: Yes    Birth control/protection: Condom  Other Topics Concern   Not on file  Social History Narrative   Not on  file   Social Drivers of Health   Tobacco Use: High Risk (07/30/2024)   Patient History    Smoking Tobacco Use: Every Day    Smokeless Tobacco Use: Never    Passive Exposure: Not on file  Financial Resource Strain: Not on file  Food Insecurity: No Food Insecurity (07/30/2024)   Epic    Worried About Programme Researcher, Broadcasting/film/video in the Last Year: Never true    Ran Out of Food in the Last Year: Never true  Transportation Needs: No Transportation Needs (07/30/2024)   Epic    Lack of Transportation (Medical): No    Lack of Transportation (Non-Medical): No  Physical Activity: Not on  file  Stress: Not on file  Social Connections: Not on file  Intimate Partner Violence: Not At Risk (07/30/2024)   Epic    Fear of Current or Ex-Partner: No    Emotionally Abused: No    Physically Abused: No    Sexually Abused: No  Depression (PHQ2-9): Low Risk (07/30/2024)   Depression (PHQ2-9)    PHQ-2 Score: 0  Alcohol Screen: Not on file  Housing: Low Risk (07/30/2024)   Epic    Unable to Pay for Housing in the Last Year: No    Number of Times Moved in the Last Year: 0    Homeless in the Last Year: No  Utilities: Not At Risk (07/30/2024)   Epic    Threatened with loss of utilities: No  Health Literacy: Not on file    FAMILY HISTORY: Family History  Problem Relation Age of Onset   Cirrhosis Father    Kidney failure Father    Cancer Maternal Aunt        pancreatic    ALLERGIES:  is allergic to iodine.  MEDICATIONS:  Current Outpatient Medications  Medication Sig Dispense Refill   Ferric Maltol  (ACCRUFER ) 30 MG CAPS Take 1 capsule (30 mg total) by mouth 2 (two) times daily. 180 capsule 0   hydrOXYzine  (ATARAX /VISTARIL ) 25 MG tablet Take 1 tablet (25 mg total) by mouth 3 (three) times daily as needed for anxiety. 30 tablet 0   traZODone  (DESYREL ) 50 MG tablet Take 1 tablet (50 mg total) by mouth at bedtime as needed for sleep. 30 tablet 0   No current facility-administered medications for this visit.    REVIEW OF SYSTEMS:   Constitutional: Denies fevers, chills or abnormal night sweats (+) intermittent fatigue Eyes: Denies blurriness of vision, double vision or watery eyes Ears, nose, mouth, throat, and face: Denies mucositis or sore throat (+) vertigo Respiratory: Denies cough, dyspnea or wheezes Cardiovascular: Denies palpitation, chest discomfort or lower extremity swelling Gastrointestinal:  Denies heartburn, hematochezia, or change in bowel habits (+) nausea Skin: Denies abnormal skin rashes Lymphatics: Denies new lymphadenopathy or easy  bruising Neurological:Denies numbness, tingling or new weaknesses (+) dizziness Behavioral/Psych: Mood is stable, no new changes  All other systems were reviewed with the patient and are negative.  PHYSICAL EXAMINATION:  Vitals:   07/30/24 1245  BP: 105/66  Pulse: (!) 103  Resp: 17  Temp: 98.9 F (37.2 C)  SpO2: 100%   Filed Weights   07/30/24 1245  Weight: 258 lb 14.4 oz (117.4 kg)    GENERAL:alert, no distress and comfortable SKIN: skin color, texture, turgor are normal, no rashes or significant lesions EYES: normal, conjunctiva are pink and non-injected, sclera clear OROPHARYNX:no exudate, no erythema and lips, buccal mucosa, and tongue normal  NECK: without mass LYMPH:  no palpable cervical or supraclavicular lymphadenopathy  LUNGS: clear with normal breathing effort HEART: regular rate & rhythm, no lower extremity edema ABDOMEN:abdomen soft, non-tender and normal bowel sounds Musculoskeletal:no cyanosis of digits and no clubbing  PSYCH: alert & oriented x 3 with fluent speech NEURO: no focal motor/sensory deficits  LABORATORY DATA:  I have reviewed the data as listed    Latest Ref Rng & Units 11/25/2017    8:05 PM 09/19/2016    5:02 PM 11/10/2014    7:00 PM  CBC  WBC 4.0 - 10.5 K/uL 8.7  8.1  9.0   Hemoglobin 12.0 - 15.0 g/dL 87.7  87.6  88.7   Hematocrit 36.0 - 46.0 % 37.6  37.1  35.0   Platelets 150 - 400 K/uL 241   248        Latest Ref Rng & Units 11/25/2017    8:05 PM 09/19/2016    5:00 PM 11/10/2014    7:00 PM  CMP  Glucose 65 - 99 mg/dL 95  82  91   BUN 6 - 20 mg/dL 9  7  13    Creatinine 0.44 - 1.00 mg/dL 9.18  9.31  9.14   Sodium 135 - 145 mmol/L 139  139  133   Potassium 3.5 - 5.1 mmol/L 3.6  4.8  3.7   Chloride 101 - 111 mmol/L 105  104  103   CO2 22 - 32 mmol/L 25  20  27    Calcium 8.9 - 10.3 mg/dL 8.9  8.7  8.7   Total Protein 6.5 - 8.1 g/dL 7.3  6.5    Total Bilirubin 0.3 - 1.2 mg/dL 0.6  0.2    Alkaline Phos 38 - 126 U/L 80  83    AST 15 - 41  U/L 28  17    ALT 14 - 54 U/L 21  8       RADIOGRAPHIC STUDIES: I have personally reviewed the radiological images as listed and agreed with the findings in the report. No results found.  ASSESSMENT & PLAN: 43 year old premenopausal female  Microcytic anemia - Reviewed her medical record in detail with the patient -She has had a mild intermittent anemia since at least 2015-2019, Hgb range 10.6-12.3 with microcytosis MCV as low as 67.  At times MCV is low with a normal hemoglobin, suggesting a possible thalassemia or hemoglobinopathy - Anemia was mild in 2022 Hgb 11.2, and worsened since 05/2019 4-20 25-9 0.4-9.6 range  - Iron panel 09/18/2023 showed serum iron 24, TIBC 426, 6% saturation, and ferritin of 15.  Oral iron is on her medication list but she has not taken it.  She has menorrhagia without other obvious bleeding.  This is likely the source of her IDA.  Has not seen OB/GYN for management - Jessica Melton appears well, relatively asymptomatic of anemia although she reports dizziness and intermittent fatigue she attributes to vertigo - Most recently 07/01/2024 serum iron 37, TIBC 409, 9% saturation, and ferritin of 15, Hgb 9.4, MCV 69 with normal platelet count and differential.  - Historically does not tolerate oral iron well, with constipation, but agrees to retry Accrufer  twice daily with vitamin C for a couple months to see if she responds/tolerates well - Repeat lab 2-3 months with a phone follow-up a week later to evaluate her response, she will call us  sooner if she is not doing well on oral iron - She will complete a stool card to rule out GI bleeding, if positive she understands we will refer her to gastroenterology - If  anemia does not resolve with sufficient iron replacement we will recommend further testing - Patient seen with Dr. Lanny    PLAN: -Records reviewed -Begin oral iron Accrufer  30 mg BID with vitamin C source -Lab (CBC, ferritin, Iron/TIBC, Hgb electrophoresis) in  2-3 months to evaluate response, phone f/up 1 week later -Pt to call sooner if she is not tolerating oral iron, will check lab and consider IV iron then -Stool card, referral to GI if positive  -Pt seen with Dr. Lanny   Orders Placed This Encounter  Procedures   Hgb Fractionation [place]    Standing Status:   Future    Expiration Date:   07/30/2025   CBC with Differential (Cancer Center Only)    Standing Status:   Future    Expiration Date:   07/30/2025   Ferritin    Standing Status:   Future    Expiration Date:   07/30/2025   Iron and Iron Binding Capacity ([facility] only)    Standing Status:   Future    Expiration Date:   07/30/2025   Occult blood card to lab, stool    Standing Status:   Future    Expiration Date:   07/30/2025   Occult blood card to lab, stool    Standing Status:   Future    Expiration Date:   07/30/2025   Occult blood card to lab, stool    Standing Status:   Future    Expiration Date:   07/30/2025     All questions were answered. The patient knows to call the clinic with any problems, questions or concerns.      Jessica Sakurai K Medford Staheli, NP 07/30/2024   Addendum I have seen the patient, examined her. I agree with the assessment and and plan and have edited the notes.   43 year old female with chronic anemia, with a hemoglobin 10-12 range, and microcytosis, was referred to us  for management.  Lab confirmed iron deficiency, likely from her menorrhagia, although she states her menstrual period is not very heavy.  I recommend a stool OB test to rule out GI bleeding.  Given the chronic anemia, unlikely GI malignancy.  She had intermittent oral iron, but complains of constipation, she has not been on it for the past several months.  Will call in Accrufer  for her, to see if she tolerates better.  Plan to repeat lab in 2 to 3 months, if she still has persistent iron deficient anemia, we will consider IV iron.  All questions were answered, patient agrees with the plan.  Onita Lanny MD 07/31/2024

## 2024-09-16 ENCOUNTER — Inpatient Hospital Stay

## 2024-09-17 ENCOUNTER — Inpatient Hospital Stay: Attending: Nurse Practitioner

## 2024-09-23 ENCOUNTER — Inpatient Hospital Stay: Admitting: Nurse Practitioner
